# Patient Record
Sex: Female | Born: 2000 | Race: White | Hispanic: No | Marital: Single | Smoking: Never smoker
Health system: Southern US, Community
[De-identification: ages and names within clinical notes are randomized; demographics above are authoritative.]

## PROBLEM LIST (undated history)

## (undated) DIAGNOSIS — K219 Gastro-esophageal reflux disease without esophagitis: Secondary | ICD-10-CM

## (undated) DIAGNOSIS — T7840XA Allergy, unspecified, initial encounter: Secondary | ICD-10-CM

## (undated) DIAGNOSIS — N946 Dysmenorrhea, unspecified: Secondary | ICD-10-CM

## (undated) HISTORY — DX: Gastro-esophageal reflux disease without esophagitis: K21.9

## (undated) HISTORY — DX: Allergy, unspecified, initial encounter: T78.40XA

## (undated) HISTORY — DX: Dysmenorrhea, unspecified: N94.6

## (undated) HISTORY — PX: LEG SURGERY: SHX1003

## (undated) HISTORY — PX: ANKLE SURGERY: SHX546

## (undated) HISTORY — PX: NO PAST SURGERIES: SHX2092

## (undated) HISTORY — PX: OTHER SURGICAL HISTORY: SHX169

---
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
## 2020-01-05 ENCOUNTER — Other Ambulatory Visit: Payer: Self-pay | Admitting: Physician Assistant

## 2020-01-05 DIAGNOSIS — F902 Attention-deficit hyperactivity disorder, combined type: Secondary | ICD-10-CM

## 2020-01-05 MED ORDER — AMPHETAMINE-DEXTROAMPHET ER 30 MG PO CP24: 30.0000 mg | ORAL_CAPSULE | Freq: Every day | ORAL | 0 refills | Status: DC

## 2020-01-05 NOTE — Telephone Encounter (Signed)
Medication Refill - Medication:  amphetamine-dextroamphetamine (ADDERALL XR) 30 MG 24 hr capsule  Has the patient contacted their pharmacy? Yes pharmacy told to call office.  Preferred Pharmacy (with phone number or street name):  [id number]ell Halim [address] Phone:  [phone]  Fax:  [phone]     Agent: Please be advised that RX refills may take up to 3 business days. We ask that you follow-up with your pharmacy.

## 2020-01-05 NOTE — Telephone Encounter (Signed)
Requested medication (s) are due for refill today: yes  Requested medication (s) are on the active medication list: yes  Last refill:  12/05/19  Future visit scheduled: no  Notes to clinic:  not delegated    Requested Prescriptions  Pending Prescriptions Disp Refills   amphetamine-dextroamphetamine (ADDERALL XR) 30 MG 24 hr capsule 30 capsule 0    Sig: Take 1 capsule (30 mg total) by mouth daily.      Not Delegated - Psychiatry:  Stimulants/ADHD Failed - 01/05/2020 11:04 AM      Failed - This refill cannot be delegated      Failed - Urine Drug Screen completed in last 360 days.      Failed - Valid encounter within last 3 months    Recent Outpatient Visits           2 months ago Attention deficit hyperactivity disorder (ADHD), combined type   [facility] [place], Alessandra Bevel[place], [place]   5 months ago Attention deficit hyperactivity disorder (ADHD), combined type   [facility] [place], [place], [place]   6 months ago Sore throat   [facility] [place], Alessandra [place], [place]   3 years ago Annual physical exam   [facility] Joycelyn Man [place], [place]

## 2020-02-17 ENCOUNTER — Other Ambulatory Visit: Payer: Self-pay | Admitting: Physician Assistant

## 2020-02-17 DIAGNOSIS — F902 Attention-deficit hyperactivity disorder, combined type: Secondary | ICD-10-CM

## 2020-02-17 MED ORDER — AMPHETAMINE-DEXTROAMPHET ER 30 MG PO CP24: 30.0000 mg | ORAL_CAPSULE | Freq: Every day | ORAL | 0 refills | Status: DC

## 2020-02-17 NOTE — Telephone Encounter (Signed)
Medication Refill - Medication:  amphetamine-dextroamphetamine (ADDERALL XR) 30 MG 24 hr capsule [[id number]]    Preferred Pharmacy (with phone number or street name):  [id number]ell Halim [address]  [address] [place] [address]  Phone: [phone] Fax: [phone]     Agent: Please be advised that RX refills may take up to 3 business days. We ask that you follow-up with your pharmacy.

## 2020-03-30 ENCOUNTER — Ambulatory Visit: Payer: 59 | Admitting: Physician Assistant

## 2020-04-02 ENCOUNTER — Encounter: Payer: Self-pay | Admitting: Physician Assistant

## 2020-04-02 ENCOUNTER — Ambulatory Visit: Payer: 59 | Admitting: Physician Assistant

## 2020-04-02 ENCOUNTER — Other Ambulatory Visit: Payer: Self-pay

## 2020-04-02 VITALS — BP 117/75 | HR 108 | Temp 96.8°F | Wt 140.0 lb

## 2020-04-02 DIAGNOSIS — L03011 Cellulitis of right finger: Secondary | ICD-10-CM | POA: Diagnosis not present

## 2020-04-02 DIAGNOSIS — F902 Attention-deficit hyperactivity disorder, combined type: Secondary | ICD-10-CM

## 2020-04-02 MED ORDER — AMPHETAMINE-DEXTROAMPHET ER 30 MG PO CP24: 30.0000 mg | ORAL_CAPSULE | Freq: Every day | ORAL | 0 refills | Status: DC

## 2020-04-02 MED ORDER — SULFAMETHOXAZOLE-TRIMETHOPRIM 800-160 MG PO TABS: 1.0000 | ORAL_TABLET | Freq: Every day | ORAL | 0 refills | Status: DC

## 2020-04-02 NOTE — Progress Notes (Signed)
Established patient visit   Patient: Krista Holmes   DOB: 2001-01-09   18 y.o. Female  MRN: [id number] Visit Date: 04/02/2020  Today's healthcare provider: Margaretann Loveless, PA-C   Chief Complaint  Patient presents with  . Hand Pain    Right finger.    Subjective    HPI Krista Holmes is an 19 yr old female that presents today with intermittent swelling, redness and pain of the 2nd finger right hand around the nail. Reports when she was a child her finger was smashed in a car door. Since then the nail has never grown in correctly. She recently had acrylic nails put on and noticed her nail bed started becoming more painful. She had the nails removed and the nail bed has continued to have redness and swelling. Pain comes and goes. Today it is not painful. She has had no discharge from it.   Patient Active Problem List   Diagnosis Date Noted  . ADHD 08/05/2019  . Anxiety 08/05/2019   Social History   Tobacco Use  . Smoking status: Never Smoker  . Smokeless tobacco: Never Used  Substance Use Topics  . Alcohol use: No  . Drug use: No   Allergies  Allergen Reactions  . Other Hives    Red Hi-C  . Omnicef [Cefdinir] Rash  . Zithromax [Azithromycin] Rash     Medications: Outpatient Medications Prior to Visit  Medication Sig  . amphetamine-dextroamphetamine (ADDERALL XR) 30 MG 24 hr capsule Take 1 capsule (30 mg total) by mouth daily.   No facility-administered medications prior to visit.    Review of Systems  Constitutional: Negative.   Respiratory: Negative.   Cardiovascular: Negative.   Musculoskeletal: Positive for arthralgias. Negative for back pain, gait problem, joint swelling, myalgias, neck pain and neck stiffness.  Neurological: Negative.       Objective    BP 117/75 (BP Location: Left Arm, Patient Position: Sitting, Cuff Size: Normal)   Pulse (!) 108   Temp (!) 96.8 F (36 C) (Temporal)   Wt 140 lb (63.5 kg)    Physical Exam Vitals  reviewed.  Constitutional:      General: She is not in acute distress.    Appearance: Normal appearance. She is well-developed and normal weight. She is not ill-appearing.  HENT:     Head: Normocephalic and atraumatic.  Pulmonary:     Effort: Pulmonary effort is normal. No respiratory distress.  Musculoskeletal:     Cervical back: Normal range of motion and neck supple.  Skin:    Findings: Erythema present.       Neurological:     Mental Status: She is alert.  Psychiatric:        Mood and Affect: Mood normal.        Behavior: Behavior normal.        Thought Content: Thought content normal.        Judgment: Judgment normal.       No results found for any visits on 04/02/20.  Assessment & Plan     1. Paronychia of finger of right hand Will treat with Bactrim as below for infection. Advised to use Epsom salt soaks as well. Call if not improving.  - sulfamethoxazole-trimethoprim (BACTRIM DS) 800-160 MG tablet; Take 1 tablet by mouth daily.  Dispense: 5 tablet; Refill: 0  2. Attention deficit hyperactivity disorder (ADHD), combined type Stable. Diagnosis pulled for medication refill. Continue current medical treatment plan. - amphetamine-dextroamphetamine (ADDERALL XR) 30 MG 24  hr capsule; Take 1 capsule (30 mg total) by mouth daily.  Dispense: 30 capsule; Refill: 0   No follow-ups on file.      Reynolds Bowl, PA-C, have reviewed all documentation for this visit. The documentation on 04/02/20 for the exam, diagnosis, procedures, and orders are all accurate and complete.   Krista Holmes  [facility] [phone] (phone) [phone] (fax)  [place]

## 2020-04-02 NOTE — Patient Instructions (Signed)
Paronychia Paronychia is an infection of the skin that surrounds a nail. It usually affects the skin around a fingernail, but it may also occur near a toenail. It often causes pain and swelling around the nail. In some cases, a collection of pus (abscess) can form near or under the nail.  This condition may develop suddenly, or it may develop gradually over a longer period. In most cases, paronychia is not serious, and it will clear up with treatment. What are the causes? This condition may be caused by bacteria or a fungus. These germs can enter the body through an opening in the skin, such as a cut or a hangnail. What increases the risk? This condition is more likely to develop in people who:  Get their hands wet often, such as those who work as dishwashers, bartenders, or nurses.  Bite their fingernails or suck their thumbs.  Trim their nails very short.  Have hangnails or injured fingertips.  Get manicures.  Have diabetes. What are the signs or symptoms? Symptoms of this condition include:  Redness and swelling of the skin near the nail.  Tenderness around the nail when you touch the area.  Pus-filled bumps under the skin at the base and sides of the nail (cuticle).  Fluid or pus under the nail.  Throbbing pain in the area. How is this diagnosed? This condition is diagnosed with a physical exam. In some cases, a sample of pus may be tested to determine what type of bacteria or fungus is causing the condition. How is this treated? Treatment depends on the cause and severity of your condition. If your condition is mild, it may clear up on its own in a few days or after soaking in warm water. If needed, treatment may include:  Antibiotic medicine, if your infection is caused by bacteria.  Antifungal medicine, if your infection is caused by a fungus.  A procedure to drain pus from an abscess.  Anti-inflammatory medicine (corticosteroids). Follow these instructions at  home: Wound care  Keep the affected area clean.  Soak the affected area in warm water, if told to do so by your health care provider. You may be told to do this for 20 minutes, 2-3 times a day.  Keep the area dry when you are not soaking it.  Do not try to drain an abscess yourself.  Follow instructions from your health care provider about how to take care of the affected area. Make sure you: ? Wash your hands with soap and water before you change your bandage (dressing). If soap and water are not available, use hand sanitizer. ? Change your dressing as told by your health care provider.  If you had an abscess drained, check the area every day for signs of infection. Check for: ? Redness, swelling, or pain. ? Fluid or blood. ? Warmth. ? Pus or a bad smell. Medicines   Take over-the-counter and prescription medicines only as told by your health care provider.  If you were prescribed an antibiotic medicine, take it as told by your health care provider. Do not stop taking the antibiotic even if you start to feel better. General instructions  Avoid contact with harsh chemicals.  Do not pick at the affected area. Prevention  To prevent this condition from happening again: ? Wear rubber gloves when washing dishes or doing other tasks that require your hands to get wet. ? Wear gloves if your hands might come in contact with cleaners or other chemicals. ? Avoid   injuring your nails or fingertips. ? Do not bite your nails or tear hangnails. ? Do not cut your nails very short. ? Do not cut your cuticles. ? Use clean nail clippers or scissors when trimming nails. Contact a health care provider if:  Your symptoms get worse or do not improve with treatment.  You have continued or increased fluid, blood, or pus coming from the affected area.  Your finger or knuckle becomes swollen or difficult to move. Get help right away if you have:  A fever or chills.  Redness spreading away  from the affected area.  Joint or muscle pain. Summary  Paronychia is an infection of the skin that surrounds a nail. It often causes pain and swelling around the nail. In some cases, a collection of pus (abscess) can form near or under the nail.  This condition may be caused by bacteria or a fungus. These germs can enter the body through an opening in the skin, such as a cut or a hangnail.  If your condition is mild, it may clear up on its own in a few days. If needed, treatment may include medicine or a procedure to drain pus from an abscess.  To prevent this condition from happening again, wear gloves if doing tasks that require your hands to get wet or to come in contact with chemicals. Also avoid injuring your nails or fingertips. This information is not intended to replace advice given to you by your health care provider. Make sure you discuss any questions you have with your health care provider. Document Revised: 11/27/2017 Document Reviewed: 11/23/2017 Elsevier Patient Education  2020 Elsevier Inc.  

## 2020-04-03 ENCOUNTER — Other Ambulatory Visit: Payer: Self-pay

## 2020-04-03 DIAGNOSIS — F902 Attention-deficit hyperactivity disorder, combined type: Secondary | ICD-10-CM

## 2020-04-03 NOTE — Telephone Encounter (Signed)
Copied from CRM [id number]. Topic: General - Other >> Apr 03, 2020  4:50 PM Dalphine Handing A wrote: Patients mother stated that the pharmacy received a blank paper yesterday and needs her prescription for adderall sent back over today. [facility]ae advise [id number] [place], [place] - [address]  Phone:  [phone] Fax:  [phone]

## 2020-04-04 MED ORDER — AMPHETAMINE-DEXTROAMPHET ER 30 MG PO CP24: 30.0000 mg | ORAL_CAPSULE | Freq: Every day | ORAL | 0 refills | Status: DC

## 2020-04-05 MED ORDER — AMPHETAMINE-DEXTROAMPHET ER 30 MG PO CP24: 30.0000 mg | ORAL_CAPSULE | Freq: Every day | ORAL | 0 refills | Status: DC

## 2020-04-05 NOTE — Addendum Note (Signed)
Addended by: Kavin Leech E on: 04/05/2020 08:08 AM   Modules accepted: Orders

## 2020-04-05 NOTE — Telephone Encounter (Signed)
Pt mother is calling and the pharm has not received the rx

## 2020-04-19 ENCOUNTER — Encounter: Payer: Self-pay | Admitting: Family Medicine

## 2020-04-19 NOTE — Progress Notes (Signed)
Patient did not keep appointment today. She may call to reschedule.  

## 2020-04-26 ENCOUNTER — Telehealth: Payer: Self-pay | Admitting: Physician Assistant

## 2020-04-26 DIAGNOSIS — L03011 Cellulitis of right finger: Secondary | ICD-10-CM

## 2020-04-26 MED ORDER — SULFAMETHOXAZOLE-TRIMETHOPRIM 800-160 MG PO TABS: 1.0000 | ORAL_TABLET | Freq: Two times a day (BID) | ORAL | 0 refills | Status: DC

## 2020-04-26 NOTE — Telephone Encounter (Signed)
Krista Holmes, at first I told them they needed to be seen.  Then I saw that it was less than a month ago and knew we couldn't see her today so I asked [facility] to do a message.  I asked [facility] to let the grandmother know that we would get a message to you but they might need to come in for more antibiotics.

## 2020-04-26 NOTE — Telephone Encounter (Signed)
Sent in Bactrim. If not improving will need re-evaluation

## 2020-04-26 NOTE — Telephone Encounter (Signed)
Patient's grandmother, Claudell Kyle is calling to request a refill for [facility] for the patient's index finger. Patient was seen on 04/02/20. Patient is reporting drainage, yellowish in color with pain. Patient has been soaking it in episom salt.  Patient's grandmother feels that it is worse than it was on 04/02/20. [facility] the [employer]. Claudell Kyle Cb- [phone]

## 2020-04-26 NOTE — Telephone Encounter (Signed)
Patient advised as below.  

## 2020-05-16 ENCOUNTER — Other Ambulatory Visit: Payer: Self-pay | Admitting: Physician Assistant

## 2020-05-16 DIAGNOSIS — F902 Attention-deficit hyperactivity disorder, combined type: Secondary | ICD-10-CM

## 2020-05-16 MED ORDER — AMPHETAMINE-DEXTROAMPHET ER 30 MG PO CP24: 30.0000 mg | ORAL_CAPSULE | Freq: Every day | ORAL | 0 refills | Status: DC

## 2020-05-16 NOTE — Telephone Encounter (Signed)
PT need a refill  amphetamine-dextroamphetamine (ADDERALL XR) 30 MG 24 hr capsule [[id number]]  [id number] [address] [address]  [address] [place] [address]  Phone: [phone] Fax: [phone]

## 2020-05-16 NOTE — Telephone Encounter (Signed)
Requested medication (s) are due for refill today: Yes  Requested medication (s) are on the active medication list: Yes  Last refill:  04/05/20  Future visit scheduled: No  Notes to clinic:  See request.    Requested Prescriptions  Pending Prescriptions Disp Refills   amphetamine-dextroamphetamine (ADDERALL XR) 30 MG 24 hr capsule 30 capsule 0    Sig: Take 1 capsule (30 mg total) by mouth daily.      Not Delegated - Psychiatry:  Stimulants/ADHD Failed - 05/16/2020  8:41 AM      Failed - This refill cannot be delegated      Failed - Urine Drug Screen completed in last 360 days.      Passed - Valid encounter within last 3 months    Recent Outpatient Visits           1 month ago Paronychia of finger of right hand   [facility] Joy[place], [place]   7 months ago Attention deficit hyperactivity disorder (ADHD), combined type   [facility] Joycelyn Man [place], [place]   9 months ago Attention deficit hyperactivity disorder (ADHD), combined type   [facility] [place], [place], [place]   11 months ago Sore throat   [facility] [place], Alessandra [place], [place]   3 years ago Annual physical exam   [facility] Margaretann Loveless, [place]

## 2020-05-16 NOTE — Telephone Encounter (Signed)
Refilled x 1 month. Patient should schedule ADHD follow up with [facility].

## 2020-05-29 ENCOUNTER — Encounter: Payer: Self-pay | Admitting: Family Medicine

## 2020-05-29 NOTE — Progress Notes (Signed)
Patient did not keep appointment today. She may call to reschedule.  

## 2020-06-25 ENCOUNTER — Other Ambulatory Visit: Payer: Self-pay | Admitting: Physician Assistant

## 2020-06-25 DIAGNOSIS — F902 Attention-deficit hyperactivity disorder, combined type: Secondary | ICD-10-CM

## 2020-06-25 MED ORDER — AMPHETAMINE-DEXTROAMPHET ER 30 MG PO CP24: 30.0000 mg | ORAL_CAPSULE | Freq: Every day | ORAL | 0 refills | Status: DC

## 2020-06-25 NOTE — Telephone Encounter (Signed)
Requested medication (s) are due for refill today: yes  Requested medication (s) are on the active medication list: yes  Last refill:  05/16/2020  Future visit scheduled: no  Notes to clinic: this refill cannot be delegated    Requested Prescriptions  Pending Prescriptions Disp Refills   amphetamine-dextroamphetamine (ADDERALL XR) 30 MG 24 hr capsule 30 capsule 0    Sig: Take 1 capsule (30 mg total) by mouth daily.      Not Delegated - Psychiatry:  Stimulants/ADHD Failed - 06/25/2020  1:14 PM      Failed - This refill cannot be delegated      Failed - Urine Drug Screen completed in last 360 days.      Passed - Valid encounter within last 3 months    Recent Outpatient Visits           2 months ago Paronychia of finger of right hand   [facility] Joycelyn [place], [place]   8 months ago Attention deficit hyperactivity disorder (ADHD), combined type   [facility] Joycelyn Man [place], [place]   10 months ago Attention deficit hyperactivity disorder (ADHD), combined type   [facility], Alessandra [place], [place]   1 year ago Sore throat   [facility] Margaretann Loveless, [place]   4 years ago Annual physical exam   [facility] [place] Margaretann Lovel[place], [place]

## 2020-06-25 NOTE — Telephone Encounter (Signed)
Medication Refill - Medication: amphetamine-dextroamphetamine (ADDERALL XR) 30 MG 24 hr capsule    Has the patient contacted their pharmacy? yes (Agent: If no, request that the patient contact the pharmacy for the refill.) (Agent: If yes, when and what did the pharmacy advise?)Contact PCP  Preferred Pharmacy (with phone number or street name):  [id number] [address]im [address] Phone:  [phone]  Fax:  [phone]       Agent: Please be advised that RX refills may take up to 3 business days. We ask that you follow-up with your pharmacy.

## 2020-07-24 ENCOUNTER — Telehealth: Payer: Self-pay

## 2020-07-24 NOTE — Telephone Encounter (Signed)
Left detailed voice message on fathers cell as requested at [phone] and tried reaching family at home number with no response.  appt has been scheduled for 07/25/20 at 9:40AM on mychart. KW

## 2020-07-24 NOTE — Telephone Encounter (Signed)
You can use my 940 appt that is blocked tomorrow morning for virtual.  Can see if anyone could squeeze in appt this afternoon.  Since today is day 10, consider calling in referral to [facility] today to see if she could start today.  Too late tomorrow.

## 2020-07-24 NOTE — Telephone Encounter (Signed)
Copied from CRM [id number]. Topic: General - Other >> Jul 24, 2020  8:10 AM Jaquita Rector A wrote: Reason for CRM: Krista Holmes patients father called to inquire of her PCP if she may need an antibiotic. States that she have covid for over a week but when she take a deep breath in she goes into a coughing fit and can't seem to stop. He is asking for a call back to discuss what to do.please call Ph#  [phone]

## 2020-07-24 NOTE — Telephone Encounter (Signed)
Recommend virtual visit to assess patient.  Unlikely that an antibiotic would play a role as she has a viral illness (COVID19)

## 2020-07-24 NOTE — Telephone Encounter (Signed)
Spoke with patients father who understood, I let father know next availability for an appointment wouldn't be until tomorrow afternoon at 2:40PM. Patients father states that he did not want to wait that long for patient to be seen, he states that patient is on day 10 of Covid and cough and shortness of breath have not been improving, patients father states that o2 stats at home have been 73 and they have been giving patient otc Mucinex with no relief. Father reports that patient has been fatigued and every time she moves there is cough starting again. I advised parent that he can take patient to urgent care for assessment and treatment but he would like to know if providers cannot see patient this afternoon if he can be squeezed in tomorrow morning? KW

## 2020-07-25 ENCOUNTER — Telehealth (INDEPENDENT_AMBULATORY_CARE_PROVIDER_SITE_OTHER): Payer: 59 | Admitting: Family Medicine

## 2020-07-25 ENCOUNTER — Encounter: Payer: Self-pay | Admitting: Family Medicine

## 2020-07-25 ENCOUNTER — Other Ambulatory Visit: Payer: Self-pay

## 2020-07-25 ENCOUNTER — Ambulatory Visit
Admission: RE | Admit: 2020-07-25 | Discharge: 2020-07-25 | Disposition: A | Discharge status: Home / Self Care | Payer: 59 | Attending: Family Medicine | Admitting: Family Medicine

## 2020-07-25 ENCOUNTER — Ambulatory Visit
Admission: RE | Admit: 2020-07-25 | Discharge: 2020-07-25 | Disposition: A | Discharge status: Home / Self Care | Payer: 59 | Source: Ambulatory Visit | Attending: Family Medicine | Admitting: Family Medicine

## 2020-07-25 DIAGNOSIS — R0602 Shortness of breath: Secondary | ICD-10-CM

## 2020-07-25 DIAGNOSIS — U071 COVID-19: Secondary | ICD-10-CM | POA: Insufficient documentation

## 2020-07-25 MED ORDER — ALBUTEROL SULFATE HFA 108 (90 BASE) MCG/ACT IN AERS: 2.0000 | INHALATION_SPRAY | Freq: Four times a day (QID) | RESPIRATORY_TRACT | 0 refills | Status: DC | PRN

## 2020-07-25 NOTE — Patient Instructions (Signed)
COVID-19 COVID-19 is a respiratory infection that is caused by a virus called severe acute respiratory syndrome coronavirus 2 (SARS-CoV-2). The disease is also known as coronavirus disease or novel coronavirus. In some people, the virus may not cause any symptoms. In others, it may cause a serious infection. The infection can get worse quickly and can lead to complications, such as:  Pneumonia, or infection of the lungs.  Acute respiratory distress syndrome or ARDS. This is a condition in which fluid build-up in the lungs prevents the lungs from filling with air and passing oxygen into the blood.  Acute respiratory failure. This is a condition in which there is not enough oxygen passing from the lungs to the body or when carbon dioxide is not passing from the lungs out of the body.  Sepsis or septic shock. This is a serious bodily reaction to an infection.  Blood clotting problems.  Secondary infections due to bacteria or fungus.  Organ failure. This is when your body's organs stop working. The virus that causes COVID-19 is contagious. This means that it can spread from person to person through droplets from coughs and sneezes (respiratory secretions). What are the causes? This illness is caused by a virus. You may catch the virus by:  Breathing in droplets from an infected person. Droplets can be spread by a person breathing, speaking, singing, coughing, or sneezing.  Touching something, like a table or a doorknob, that was exposed to the virus (contaminated) and then touching your mouth, nose, or eyes. What increases the risk? Risk for infection You are more likely to be infected with this virus if you:  Are within 6 feet (2 meters) of a person with COVID-19.  Provide care for or live with a person who is infected with COVID-19.  Spend time in crowded indoor spaces or live in shared housing. Risk for serious illness You are more likely to become seriously ill from the virus if you:   Are 50 years of age or older. The higher your age, the more you are at risk for serious illness.  Live in a nursing home or long-term care facility.  Have cancer.  Have a long-term (chronic) disease such as: ? Chronic lung disease, including chronic obstructive pulmonary disease or asthma. ? A long-term disease that lowers your body's ability to fight infection (immunocompromised). ? Heart disease, including heart failure, a condition in which the arteries that lead to the heart become narrow or blocked (coronary artery disease), a disease which makes the heart muscle thick, weak, or stiff (cardiomyopathy). ? Diabetes. ? Chronic kidney disease. ? Sickle cell disease, a condition in which red blood cells have an abnormal "sickle" shape. ? Liver disease.  Are obese. What are the signs or symptoms? Symptoms of this condition can range from mild to severe. Symptoms may appear any time from 2 to 14 days after being exposed to the virus. They include:  A fever or chills.  A cough.  Difficulty breathing.  Headaches, body aches, or muscle aches.  Runny or stuffy (congested) nose.  A sore throat.  New loss of taste or smell. Some people may also have stomach problems, such as nausea, vomiting, or diarrhea. Other people may not have any symptoms of COVID-19. How is this diagnosed? This condition may be diagnosed based on:  Your signs and symptoms, especially if: ? You live in an area with a COVID-19 outbreak. ? You recently traveled to or from an area where the virus is common. ? You   provide care for or live with a person who was diagnosed with COVID-19. ? You were exposed to a person who was diagnosed with COVID-19.  A physical exam.  Lab tests, which may include: ? Taking a sample of fluid from the back of your nose and throat (nasopharyngeal fluid), your nose, or your throat using a swab. ? A sample of mucus from your lungs (sputum). ? Blood tests.  Imaging tests, which  may include, X-rays, CT scan, or ultrasound. How is this treated? At present, there is no medicine to treat COVID-19. Medicines that treat other diseases are being used on a trial basis to see if they are effective against COVID-19. Your health care provider will talk with you about ways to treat your symptoms. For most people, the infection is mild and can be managed at home with rest, fluids, and over-the-counter medicines. Treatment for a serious infection usually takes places in a hospital intensive care unit (ICU). It may include one or more of the following treatments. These treatments are given until your symptoms improve.  Receiving fluids and medicines through an IV.  Supplemental oxygen. Extra oxygen is given through a tube in the nose, a face mask, or a hood.  Positioning you to lie on your stomach (prone position). This makes it easier for oxygen to get into the lungs.  Continuous positive airway pressure (CPAP) or bi-level positive airway pressure (BPAP) machine. This treatment uses mild air pressure to keep the airways open. A tube that is connected to a motor delivers oxygen to the body.  Ventilator. This treatment moves air into and out of the lungs by using a tube that is placed in your windpipe.  Tracheostomy. This is a procedure to create a hole in the neck so that a breathing tube can be inserted.  Extracorporeal membrane oxygenation (ECMO). This procedure gives the lungs a chance to recover by taking over the functions of the heart and lungs. It supplies oxygen to the body and removes carbon dioxide. Follow these instructions at home: Lifestyle  If you are sick, stay home except to get medical care. Your health care provider will tell you how long to stay home. Call your health care provider before you go for medical care.  Rest at home as told by your health care provider.  Do not use any products that contain nicotine or tobacco, such as cigarettes, e-cigarettes, and  chewing tobacco. If you need help quitting, ask your health care provider.  Return to your normal activities as told by your health care provider. Ask your health care provider what activities are safe for you. General instructions  Take over-the-counter and prescription medicines only as told by your health care provider.  Drink enough fluid to keep your urine pale yellow.  Keep all follow-up visits as told by your health care provider. This is important. How is this prevented?  There is no vaccine to help prevent COVID-19 infection. However, there are steps you can take to protect yourself and others from this virus. To protect yourself:   Do not travel to areas where COVID-19 is a risk. The areas where COVID-19 is reported change often. To identify high-risk areas and travel restrictions, check the CDC travel website: wwwnc.cdc.gov/travel/notices  If you live in, or must travel to, an area where COVID-19 is a risk, take precautions to avoid infection. ? Stay away from people who are sick. ? Wash your hands often with soap and water for 20 seconds. If soap and water   are not available, use an alcohol-based hand sanitizer. ? Avoid touching your mouth, face, eyes, or nose. ? Avoid going out in public, follow guidance from your state and local health authorities. ? If you must go out in public, wear a cloth face covering or face mask. Make sure your mask covers your nose and mouth. ? Avoid crowded indoor spaces. Stay at least 6 feet (2 meters) away from others. ? Disinfect objects and surfaces that are frequently touched every day. This may include:  Counters and tables.  Doorknobs and light switches.  Sinks and faucets.  Electronics, such as phones, remote controls, keyboards, computers, and tablets. To protect others: If you have symptoms of COVID-19, take steps to prevent the virus from spreading to others.  If you think you have a COVID-19 infection, contact your health care  provider right away. Tell your health care team that you think you may have a COVID-19 infection.  Stay home. Leave your house only to seek medical care. Do not use public transport.  Do not travel while you are sick.  Wash your hands often with soap and water for 20 seconds. If soap and water are not available, use alcohol-based hand sanitizer.  Stay away from other members of your household. Let healthy household members care for children and pets, if possible. If you have to care for children or pets, wash your hands often and wear a mask. If possible, stay in your own room, separate from others. Use a different bathroom.  Make sure that all people in your household wash their hands well and often.  Cough or sneeze into a tissue or your sleeve or elbow. Do not cough or sneeze into your hand or into the air.  Wear a cloth face covering or face mask. Make sure your mask covers your nose and mouth. Where to find more information  Centers for Disease Control and Prevention: www.cdc.gov/coronavirus/2019-ncov/index.html  World Health Organization: www.who.int/health-topics/coronavirus Contact a health care provider if:  You live in or have traveled to an area where COVID-19 is a risk and you have symptoms of the infection.  You have had contact with someone who has COVID-19 and you have symptoms of the infection. Get help right away if:  You have trouble breathing.  You have pain or pressure in your chest.  You have confusion.  You have bluish lips and fingernails.  You have difficulty waking from sleep.  You have symptoms that get worse. These symptoms may represent a serious problem that is an emergency. Do not wait to see if the symptoms will go away. Get medical help right away. Call your local emergency services (911 in the [place] Do not drive yourself to the hospital. Let the emergency medical personnel know if you think you have COVID-19. Summary  COVID-19 is a  respiratory infection that is caused by a virus. It is also known as coronavirus disease or novel coronavirus. It can cause serious infections, such as pneumonia, acute respiratory distress syndrome, acute respiratory failure, or sepsis.  The virus that causes COVID-19 is contagious. This means that it can spread from person to person through droplets from breathing, speaking, singing, coughing, or sneezing.  You are more likely to develop a serious illness if you are 50 years of age or older, have a weak immune system, live in a nursing home, or have chronic disease.  There is no medicine to treat COVID-19. Your health care provider will talk with you about ways to treat your symptoms.    Take steps to protect yourself and others from infection. Wash your hands often and disinfect objects and surfaces that are frequently touched every day. Stay away from people who are sick and wear a mask if you are sick. This information is not intended to replace advice given to you by your health care provider. Make sure you discuss any questions you have with your health care provider. Document Revised: 09/09/2019 Document Reviewed: 12/16/2018 Elsevier Patient Education  2020 Elsevier Inc.  

## 2020-07-25 NOTE — Progress Notes (Signed)
MyChart Video Visit    Virtual Visit via Video Note   This visit type was conducted due to national recommendations for restrictions regarding the COVID-19 Pandemic (e.g. social distancing) in an effort to limit this patient's exposure and mitigate transmission in our community. This patient is at least at moderate risk for complications without adequate follow up. This format is felt to be most appropriate for this patient at this time. Physical exam was limited by quality of the video and audio technology used for the visit.    Patient location: home Provider location: [facility] Persons involved in the visit: patient, provider   I discussed the limitations of evaluation and management by telemedicine and the availability of in person appointments. The patient expressed understanding and agreed to proceed.    Patient: Krista Holmes   DOB: 05-Mar-2001   19 y.o. Female  MRN: [id number] Visit Date: 07/25/2020  Today's healthcare provider: Shirlee Latch, MD   Chief Complaint  Patient presents with  . Cough   Subjective    HPI  Patient C/O cough x 12 days. Patient was seen at [facility] ER and tested positive for COVID on 07/20/2020. Patient is taking Guaifenesin prescribed by ER provider. Patient reports cough is unchanged. Reports shortness of breath with cough and activity. Patient reports cough is worse with activity.   Reviewed her records from [facility] health emergency department on 07/20/2020.  Also reviewed her imaging from that time.  2 view chest x-ray shows interstitial and hazy opacities in bilateral lung bases commonly seen with atypical/viral pneumonia.  Patient Active Problem List   Diagnosis Date Noted  . ADHD 08/05/2019  . Anxiety 08/05/2019   Social History   Tobacco Use  . Smoking status: Never Smoker  . Smokeless tobacco: Never Used  Substance Use Topics  . Alcohol use: No  . Drug use: No   Allergies  Allergen Reactions  . Other Hives     Red Hi-C  . Omnicef [Cefdinir] Rash  . Zithromax [Azithromycin] Rash      Medications: Outpatient Medications Prior to Visit  Medication Sig  . amphetamine-dextroamphetamine (ADDERALL XR) 30 MG 24 hr capsule Take 1 capsule (30 mg total) by mouth daily.  . Guaifenesin 200 MG/5ML LIQD Take by mouth.  . [DISCONTINUED] sulfamethoxazole-trimethoprim (BACTRIM DS) 800-160 MG tablet Take 1 tablet by mouth 2 (two) times daily.   No facility-administered medications prior to visit.    Review of Systems  Constitutional: Negative for chills and fever.  HENT: Positive for congestion and ear pain. Negative for rhinorrhea and sore throat.   Respiratory: Positive for cough, chest tightness and shortness of breath.       Objective    There were no vitals taken for this visit.   Physical Exam Constitutional:      General: She is not in acute distress.    Appearance: Normal appearance. She is not diaphoretic.  HENT:     Head: Normocephalic and atraumatic.  Pulmonary:     Effort: No respiratory distress.     Comments: Some pauses when talking a lot, but able to speak in full sentences Neurological:     Mental Status: She is alert and oriented to person, place, and time. Mental status is at baseline.  Psychiatric:        Mood and Affect: Mood normal.        Behavior: Behavior normal.        Assessment & Plan     1. COVID-19 2.  Shortness of breath -Tested positive for COVID-19 on 8/27, but she is on day 12 symptoms -She has been fever free for more than 48 hours and symptoms are improving, so she is able to come out of self-isolation -Given ongoing shortness of breath and previous chest x-ray results showing possible atypical pneumonia, will repeat chest x-ray to ensure no ongoing pneumonia -If chest x-ray shows ongoing opacities, consider possible superimposed bacterial pneumonia in the setting of COVID-19 and consider azithromycin -Discussed importance of seeking emergent  assistance if she becomes short of breath at rest and is unable to speak in full sentences -Albuterol to use as needed for symptom management -Discussed return precautions - DG Chest 2 View; Future   Return if symptoms worsen or fail to improve.     I discussed the assessment and treatment plan with the patient. The patient was provided an opportunity to ask questions and all were answered. The patient agreed with the plan and demonstrated an understanding of the instructions.   The patient was advised to call back or seek an in-person evaluation if the symptoms worsen or if the condition fails to improve as anticipated.  I, Shirlee Latch, MD, have reviewed all documentation for this visit. The documentation on 07/25/20 for the exam, diagnosis, procedures, and orders are all accurate and complete.   Katiejo Gilroy, Marzella Schlein, MD, MPH [facility]

## 2020-07-26 ENCOUNTER — Telehealth: Payer: Self-pay | Admitting: Physician Assistant

## 2020-07-26 ENCOUNTER — Telehealth: Payer: Self-pay

## 2020-07-26 DIAGNOSIS — U071 COVID-19: Secondary | ICD-10-CM

## 2020-07-26 MED ORDER — AMOXICILLIN-POT CLAVULANATE 875-125 MG PO TABS: 1.0000 | ORAL_TABLET | Freq: Two times a day (BID) | ORAL | 0 refills | Status: DC

## 2020-07-26 MED ORDER — DOXYCYCLINE HYCLATE 100 MG PO TABS: 100.0000 mg | ORAL_TABLET | Freq: Two times a day (BID) | ORAL | 0 refills | Status: DC

## 2020-07-26 NOTE — Telephone Encounter (Signed)
Unfortunately, the other option is azithromycin, which is on her allergy list.  Could use Levaquin, but this is more likely to cause GI issues than doxycycline even. Would take with a probiotic and see if she can tolerate.

## 2020-07-26 NOTE — Telephone Encounter (Signed)
Augmentin sent in to [facility]  Can stop Doxycycline

## 2020-07-26 NOTE — Telephone Encounter (Signed)
Patient's mother called to inform the doctor that the antibiotic prescribed is making the patient feel sick on the stomach.  Would like to know if there is another antibiotic that is just as effective.  CB# [phone] Alcario Drought, mother)

## 2020-07-26 NOTE — Telephone Encounter (Signed)
Pt advised.  She wanted to know if it is possible to try a different antibiotic.  She states Doxycycline cause a lot of stomach issues the last time she took it.  Please advise.   Pharmacy [facility] [place].    Thanks,   -Vernona Rieger

## 2020-07-26 NOTE — Telephone Encounter (Signed)
Left detailed message advising pt below. Doxy was sent to [facility] in [place],   -Vernona Rieger

## 2020-07-26 NOTE — Telephone Encounter (Signed)
Patients mother has been advised. KW 

## 2020-07-26 NOTE — Telephone Encounter (Signed)
-----   Message from Erasmo Downer, MD sent at 07/26/2020  8:39 AM EDT ----- Chest Xray shows bilateral pneumonia.  Start doxycycline 100mg  twice daily for 7 days - please send eRx.

## 2020-08-03 ENCOUNTER — Telehealth: Payer: Self-pay

## 2020-08-03 DIAGNOSIS — U071 COVID-19: Secondary | ICD-10-CM

## 2020-08-03 DIAGNOSIS — J1282 Pneumonia due to coronavirus disease 2019: Secondary | ICD-10-CM

## 2020-08-03 MED ORDER — PREDNISONE 10 MG (21) PO TBPK: ORAL_TABLET | ORAL | 0 refills | Status: DC

## 2020-08-03 MED ORDER — AMOXICILLIN-POT CLAVULANATE 875-125 MG PO TABS: 1.0000 | ORAL_TABLET | Freq: Two times a day (BID) | ORAL | 0 refills | Status: DC

## 2020-08-03 NOTE — Telephone Encounter (Signed)
Velna Hatchet grandmother advised as directed below.She is not sure which pharmacy is close by [place] but she is going to call back to give information. Ok for [facility] nurse to take information.

## 2020-08-03 NOTE — Telephone Encounter (Signed)
Sent in medications to [facility]

## 2020-08-03 NOTE — Telephone Encounter (Signed)
Copied from CRM [id number]. Topic: General - Inquiry >> Aug 02, 2020  2:12 PM Krista Holmes wrote: Reason for CRM: Patient has started experiencing sinus pressure and still has a cough that requires the use of an inhaler. The mother Krista Holmes) would like to know if she needs another round of the antibiotic. Some shortness of breath when coughing. She does look and seem better but concerned about the sinus pressure that has come now. Please call the mother back at [phone]. Please advise

## 2020-08-03 NOTE — Telephone Encounter (Signed)
Please review. Thanks!  

## 2020-08-03 NOTE — Telephone Encounter (Signed)
Spoke with patients grandmother Velna Hatchet who states that patient had virtual appt with Dr. Leonard Schwartz on 07/25/20 and started on Doxycyline. On 07/26/20 patients mother called office back stating that medication was not sitting well with patient and we switched patient to Augmentin. Patients grandmother states that patient never picked up Augmentin and continued course of Doxy along with otc Pepcid. Patients grandmother reports that patient seemed to be doing well Sunday but cough is still present and shortness of breath.Patients grandmother states that patient is using Ventolin inhaler to control her cough every 6hrs and when she is not using inhaler she has a dry like cough. As of the other day patient started to complain of sinus pressure, she has returned back to school and grandmother would like to know if patient needs to be on another round of antibiotics or if there is anything she can take otc. There are no open appointments available in office today with any provider. Please advise. KW

## 2020-08-03 NOTE — Addendum Note (Signed)
Addended by: Margaretann Loveless on: 08/03/2020 05:33 PM   Modules accepted: Orders

## 2020-08-03 NOTE — Telephone Encounter (Signed)
She could pick up the augmentin or I could send to [facility] if I needed. Also could add prednisone to the ventolin

## 2020-08-03 NOTE — Telephone Encounter (Signed)
Pt grandmother called back to request that the Rx [facility] [address]

## 2020-08-16 ENCOUNTER — Other Ambulatory Visit: Payer: Self-pay | Admitting: Physician Assistant

## 2020-08-16 DIAGNOSIS — F902 Attention-deficit hyperactivity disorder, combined type: Secondary | ICD-10-CM

## 2020-08-16 NOTE — Telephone Encounter (Signed)
Medication Refill - Medication: Adderall 30 mg XR  Has the patient contacted their pharmacy? No. (Agent: If no, request that the patient contact the pharmacy for the refill.) (Agent: If yes, when and what did the pharmacy advise?)  Preferred Pharmacy (with phone number or street name): [facility] [place]  [address]  Agent: Please be advised that RX refills may take up to 3 business days. We ask that you follow-up with your pharmacy.

## 2020-08-16 NOTE — Telephone Encounter (Signed)
Requested medication (s) are due for refill today: yes  Requested medication (s) are on the active medication list: yes  Last refill:  06/25/20  Future visit scheduled: no  Notes to clinic: not delegated    Requested Prescriptions  Pending Prescriptions Disp Refills   amphetamine-dextroamphetamine (ADDERALL XR) 30 MG 24 hr capsule 30 capsule 0    Sig: Take 1 capsule (30 mg total) by mouth daily.      Not Delegated - Psychiatry:  Stimulants/ADHD Failed - 08/16/2020  2:57 PM      Failed - This refill cannot be delegated      Failed - Urine Drug Screen completed in last 360 days.      Passed - Valid encounter within last 3 months    Recent Outpatient Visits           3 weeks ago COVID-19   [facility], Marzella Schlein, MD   4 months ago Paronychia of finger of right hand   [facility] Joycelyn Man [place], [place]   10 months ago Attention deficit hyperactivity disorder (ADHD), combined type   [facility], Alessandra Bevels, PA-C   1 year ago Attention deficit hyperactivity disorder (ADHD), combined type   [facility], Alessandra Bevels, [place]   1 year ago Sore throat   [facility] Joycelyn Man [place], [place]

## 2020-08-17 MED ORDER — AMPHETAMINE-DEXTROAMPHET ER 30 MG PO CP24: 30.0000 mg | ORAL_CAPSULE | Freq: Every day | ORAL | 0 refills | Status: DC

## 2020-09-03 ENCOUNTER — Encounter: Payer: Self-pay | Admitting: Physician Assistant

## 2020-09-03 ENCOUNTER — Telehealth (INDEPENDENT_AMBULATORY_CARE_PROVIDER_SITE_OTHER): Payer: 59 | Admitting: Physician Assistant

## 2020-09-03 DIAGNOSIS — Z30011 Encounter for initial prescription of contraceptive pills: Secondary | ICD-10-CM | POA: Diagnosis not present

## 2020-09-03 MED ORDER — NORETHIN ACE-ETH ESTRAD-FE 1-20 MG-MCG PO TABS: 1.0000 | ORAL_TABLET | Freq: Every day | ORAL | 4 refills | Status: DC

## 2020-09-03 NOTE — Progress Notes (Signed)
MyChart Video Visit    Virtual Visit via Video Note   This visit type was conducted due to national recommendations for restrictions regarding the COVID-19 Pandemic (e.g. social distancing) in an effort to limit this patient's exposure and mitigate transmission in our community. This patient is at least at moderate risk for complications without adequate follow up. This format is felt to be most appropriate for this patient at this time. Physical exam was limited by quality of the video and audio technology used for the visit.   Patient location: Home Provider location: [facility]  I discussed the limitations of evaluation and management by telemedicine and the availability of in person appointments. The patient expressed understanding and agreed to proceed.  Patient: Krista Holmes   DOB: 12/22/2000   19 y.o. Female  MRN: [id number] Visit Date: 09/03/2020  Today's healthcare provider: Margaretann Loveless, PA-C   No chief complaint on file.  Subjective    HPI   Krista Holmes presents today via mychart video visit for OCP. She reports she would like to start for acne control and for period control. She reports she does not have terrible PMS symptoms, but she does have irregular menstrual cycles. She has not tried anything in the past.   Patient Active Problem List   Diagnosis Date Noted  . ADHD 08/05/2019  . Anxiety 08/05/2019   Past Medical History:  Diagnosis Date  . Allergy    Seasonal allergies/ sneezing, coughing etc  . GERD (gastroesophageal reflux disease)       Medications: Outpatient Medications Prior to Visit  Medication Sig  . albuterol (VENTOLIN HFA) 108 (90 Base) MCG/ACT inhaler Inhale 2 puffs into the lungs every 6 (six) hours as needed for wheezing or shortness of breath.  Marland Kitchen amoxicillin-clavulanate (AUGMENTIN) 875-125 MG tablet Take 1 tablet by mouth 2 (two) times daily.  Marland Kitchen amphetamine-dextroamphetamine (ADDERALL XR) 30 MG 24 hr capsule Take 1 capsule (30  mg total) by mouth daily.  . Guaifenesin 200 MG/5ML LIQD Take by mouth.  . predniSONE (STERAPRED UNI-PAK 21 TAB) 10 MG (21) TBPK tablet 6 day taper; take as directed on package instructions   No facility-administered medications prior to visit.    Review of Systems  Last CBC No results found for: WBC, HGB, HCT, MCV, MCH, RDW, PLT Last metabolic panel No results found for: GLUCOSE, NA, K, CL, CO2, BUN, CREATININE, GFRNONAA, GFRAA, CALCIUM, PHOS, PROT, ALBUMIN, LABGLOB, AGRATIO, BILITOT, ALKPHOS, AST, ALT, ANIONGAP    Objective    There were no vitals taken for this visit. BP Readings from Last 3 Encounters:  04/02/20 117/75  05/22/16 110/70 (54 %, Z = 0.10 /  66 %, Z = 0.42)*   *BP percentiles are based on the 2017 AAP Clinical Practice Guideline for girls   Wt Readings from Last 3 Encounters:  04/02/20 140 lb (63.5 kg) (73 %, Z= 0.60)*  06/21/19 140 lb (63.5 kg) (75 %, Z= 0.68)*  05/22/16 157 lb 12.8 oz (71.6 kg) (93 %, Z= 1.46)*   * Growth percentiles are based on CDC (Girls, 2-20 Years) data.      Physical Exam     Assessment & Plan     1. Encounter for oral contraception initial prescription Will start Junel as below. Advised to try for 3 months and call if having any breakthrough symptoms or not great control of the acne we can increase dose. She agrees.  - norethindrone-ethinyl estradiol (JUNEL FE 1/20) 1-20 MG-MCG tablet; Take 1 tablet by  mouth daily.  Dispense: 84 tablet; Refill: 4   No follow-ups on file.     I discussed the assessment and treatment plan with the patient. The patient was provided an opportunity to ask questions and all were answered. The patient agreed with the plan and demonstrated an understanding of the instructions.   The patient was advised to call back or seek an in-person evaluation if the symptoms worsen or if the condition fails to improve as anticipated.  I provided 13 minutes of face-to-face time during this encounter via MyChart  Video enabled encounter.  Delmer Islam, PA-C, have reviewed all documentation for this visit. The documentation on 09/03/20 for the exam, diagnosis, procedures, and orders are all accurate and complete.  Reine Just [facility] [phone] (phone) [phone] (fax)  [facility]

## 2020-09-18 ENCOUNTER — Other Ambulatory Visit: Payer: Self-pay | Admitting: Physician Assistant

## 2020-09-18 DIAGNOSIS — F902 Attention-deficit hyperactivity disorder, combined type: Secondary | ICD-10-CM

## 2020-09-18 NOTE — Telephone Encounter (Signed)
Requested medication (s) are due for refill today: yes  Requested medication (s) are on the active medication list: yes  Last refill:  08/17/20  Future visit scheduled: no  Notes to clinic:  not delegated    Requested Prescriptions  Pending Prescriptions Disp Refills   amphetamine-dextroamphetamine (ADDERALL XR) 30 MG 24 hr capsule 30 capsule 0    Sig: Take 1 capsule (30 mg total) by mouth daily.      Not Delegated - Psychiatry:  Stimulants/ADHD Failed - 09/18/2020 11:08 AM      Failed - This refill cannot be delegated      Failed - Urine Drug Screen completed in last 360 days.      Passed - Valid encounter within last 3 months    Recent Outpatient Visits           2 weeks ago Encounter for oral contraception initial prescription   [facility] [place], Alessandra Bevels, [place]   1 month ago COVID-19   [facility], Marzella Schlein, [place]   5 months ago Paronychia of finger of right hand   [facility] Joycelyn Man M, [place]   11 months ago Attention deficit hyperactivity disorder (ADHD), combined type   [facility], Alessandra Bevels, PA-C   1 year ago Attention deficit hyperactivity disorder (ADHD), combined type   [facility], [place], [place]

## 2020-09-18 NOTE — Telephone Encounter (Signed)
Pt request refill   amphetamine-dextroamphetamine (ADDERALL XR) 30 MG 24 hr capsule  [id number] [place], [place] - [address] AT [facility] [address] Phone:  [phone]  Fax:  [phone]

## 2020-09-19 MED ORDER — AMPHETAMINE-DEXTROAMPHET ER 30 MG PO CP24: 30.0000 mg | ORAL_CAPSULE | Freq: Every day | ORAL | 0 refills | Status: DC

## 2020-10-02 ENCOUNTER — Telehealth (INDEPENDENT_AMBULATORY_CARE_PROVIDER_SITE_OTHER): Payer: 59 | Admitting: Physician Assistant

## 2020-10-02 DIAGNOSIS — S6991XA Unspecified injury of right wrist, hand and finger(s), initial encounter: Secondary | ICD-10-CM

## 2020-10-02 DIAGNOSIS — M7989 Other specified soft tissue disorders: Secondary | ICD-10-CM

## 2020-10-02 NOTE — Progress Notes (Signed)
MyChart Video Visit    Virtual Visit via Video Note   This visit type was conducted due to national recommendations for restrictions regarding the COVID-19 Pandemic (e.g. social distancing) in an effort to limit this patient's exposure and mitigate transmission in our community. This patient is at least at moderate risk for complications without adequate follow up. This format is felt to be most appropriate for this patient at this time. Physical exam was limited by quality of the video and audio technology used for the visit.   Patient location: Home Provider location: Office   I discussed the limitations of evaluation and management by telemedicine and the availability of in person appointments. The patient expressed understanding and agreed to proceed.  Patient: Krista Holmes   DOB: [birth year]   19 y.o. Female  MRN: [id number] Visit Date: 10/02/2020  Today's healthcare provider: Trey Sailors, PA-C   Chief Complaint  Patient presents with  . finger swelling   Subjective     She reports she had an injury as a young child where she damaged her right index finger and it required a procedure to repair it. She reports her fingernail has grown back abnormally since then. Since may of this year she has been having issues with finger swelling of her right index finger. She was treated for paronychia in May 2021 with Bactrim. She reports this antibiotic as well as several other did not help. She denies pain. She denies drainage from the area.      Medications: Outpatient Medications Prior to Visit  Medication Sig  . amphetamine-dextroamphetamine (ADDERALL XR) 30 MG 24 hr capsule Take 1 capsule (30 mg total) by mouth daily.  . norethindrone-ethinyl estradiol (JUNEL FE 1/20) 1-20 MG-MCG tablet Take 1 tablet by mouth daily.   No facility-administered medications prior to visit.    Review of Systems  HENT: Positive for congestion.   Respiratory: Positive for cough.        Objective    There were no vitals taken for this visit.   Physical Exam Constitutional:      Appearance: Normal appearance.  Musculoskeletal:     Comments: Some mild swelling of distal end of right finger tip. The nailbed does appear slightly irregular at the proximal end.   Neurological:     Mental Status: She is alert.  Psychiatric:        Mood and Affect: Mood normal.        Behavior: Behavior normal.        Assessment & Plan    1. Finger swelling  Recurrent issue that does not seem to respond to antibiotics. May be another dermatologic condition, will have her see derm. Offered xray, she defers right now but we can order if she changes her mind. - Ambulatory referral to Dermatology  2. Injury of finger of right hand, initial encounter    No follow-ups on file.     I discussed the assessment and treatment plan with the patient. The patient was provided an opportunity to ask questions and all were answered. The patient agreed with the plan and demonstrated an understanding of the instructions.   The patient was advised to call back or seek an in-person evaluation if the symptoms worsen or if the condition fails to improve as anticipated.  I spent 20 minutes dedicated to the care of this patient on the date of this encounter to include pre-visit review of records, face-to-face time with the patient discussing skin infections, and post  visit ordering of testing.  ITrey Sailors, PA-C, have reviewed all documentation for this visit. The documentation on 10/02/20 for the exam, diagnosis, procedures, and orders are all accurate and complete.  The entirety of the information documented in the History of Present Illness, Review of Systems and Physical Exam were personally obtained by me. Portions of this information were initially documented by [facility] and reviewed by me for thoroughness and accuracy.    Maryella Shivers [facility] [phone] (phone) [phone] (fax)  [facility]

## 2020-10-23 ENCOUNTER — Other Ambulatory Visit: Payer: Self-pay | Admitting: Physician Assistant

## 2020-10-23 DIAGNOSIS — F902 Attention-deficit hyperactivity disorder, combined type: Secondary | ICD-10-CM

## 2020-10-23 MED ORDER — AMPHETAMINE-DEXTROAMPHET ER 30 MG PO CP24: 30.0000 mg | ORAL_CAPSULE | Freq: Every day | ORAL | 0 refills | Status: DC

## 2020-10-23 NOTE — Telephone Encounter (Signed)
Medication Refill - Medication: amphetamine-dextroamphetamine (ADDERALL XR) 30 MG 24 hr capsule norethindrone-ethinyl estradiol (JUNEL FE 1/20) 1-20 MG-MCG tablet      Preferred Pharmacy (with phone number or street name):  [id number] [place], [place] - [address] AT [facility] [address] Phone:  [phone]  Fax:  [phone]       Agent: Please be advised that RX refills may take up to 3 business days. We ask that you follow-up with your pharmacy.

## 2020-10-23 NOTE — Telephone Encounter (Signed)
Requested medication (s) are due for refill today:   Provider to determine  Requested medication (s) are on the active medication list:   Yes  Future visit scheduled:   No   Last ordered: 09/19/2020 #30, 0 refills  Non delegated refill   Requested Prescriptions  Pending Prescriptions Disp Refills   amphetamine-dextroamphetamine (ADDERALL XR) 30 MG 24 hr capsule 30 capsule 0    Sig: Take 1 capsule (30 mg total) by mouth daily.      Not Delegated - Psychiatry:  Stimulants/ADHD Failed - 10/23/2020  3:15 PM      Failed - This refill cannot be delegated      Failed - Urine Drug Screen completed in last 360 days      Passed - Valid encounter within last 3 months    Recent Outpatient Visits           3 weeks ago Finger swelling   [facility], Lavella Hammock, PA-C   1 month ago Encounter for oral contraception initial prescription   [facility] Margaretann Loveless, [place]   3 months ago COVID-19   [facility], Marzella Schlein, [place]   6 months ago Paronychia of finger of right hand   [facility] [place], Alessandra Bevels, [place]   1 year ago Attention deficit hyperactivity disorder (ADHD), combined type   [facility], Alessandra Bevels, [place]

## 2020-11-28 ENCOUNTER — Ambulatory Visit: Payer: 59 | Admitting: Physician Assistant

## 2020-11-30 ENCOUNTER — Ambulatory Visit
Admission: RE | Admit: 2020-11-30 | Discharge: 2020-11-30 | Disposition: A | Discharge status: Home / Self Care | Payer: 59 | Attending: Physician Assistant | Admitting: Physician Assistant

## 2020-11-30 ENCOUNTER — Other Ambulatory Visit: Payer: Self-pay

## 2020-11-30 ENCOUNTER — Ambulatory Visit
Admission: RE | Admit: 2020-11-30 | Discharge: 2020-11-30 | Disposition: A | Discharge status: Home / Self Care | Payer: 59 | Source: Ambulatory Visit | Attending: Physician Assistant | Admitting: Physician Assistant

## 2020-11-30 ENCOUNTER — Telehealth (INDEPENDENT_AMBULATORY_CARE_PROVIDER_SITE_OTHER): Payer: 59 | Admitting: Physician Assistant

## 2020-11-30 DIAGNOSIS — R059 Cough, unspecified: Secondary | ICD-10-CM | POA: Insufficient documentation

## 2020-11-30 DIAGNOSIS — Z8616 Personal history of COVID-19: Secondary | ICD-10-CM

## 2020-11-30 DIAGNOSIS — F902 Attention-deficit hyperactivity disorder, combined type: Secondary | ICD-10-CM | POA: Diagnosis not present

## 2020-11-30 MED ORDER — AMPHETAMINE-DEXTROAMPHET ER 30 MG PO CP24: 30.0000 mg | ORAL_CAPSULE | Freq: Every day | ORAL | 0 refills | Status: DC

## 2020-11-30 MED ORDER — PREDNISONE 20 MG PO TABS: 20.0000 mg | ORAL_TABLET | Freq: Every day | ORAL | 0 refills | Status: DC

## 2020-11-30 NOTE — Progress Notes (Signed)
MyChart Video Visit    Virtual Visit via Video Note   This visit type was conducted due to national recommendations for restrictions regarding the COVID-19 Pandemic (e.g. social distancing) in an effort to limit this patient's exposure and mitigate transmission in our community. This patient is at least at moderate risk for complications without adequate follow up. This format is felt to be most appropriate for this patient at this time. Physical exam was limited by quality of the video and audio technology used for the visit.   Patient location: Home Provider location: Home office in [place]  I discussed the limitations of evaluation and management by telemedicine and the availability of in person appointments. The patient expressed understanding and agreed to proceed.  Patient: Krista Holmes   DOB: 2001-03-15   20 y.o. Female  MRN: [id number] Visit Date: 11/30/2020  Today's healthcare provider: Margaretann Loveless, PA-C   No chief complaint on file.  Subjective    HPI  Krista Holmes is a 20 yr old female that presents via mychart video visit for follow up of pneumonia. Patient had covid pneumonia in late August-early September 2021. She reports since she has had a slight cough. It can be worse with lying flat. Would like evaluation prior to her going back to college at [employer].  Patient Active Problem List   Diagnosis Date Noted  . ADHD 08/05/2019  . Anxiety 08/05/2019   Past Medical History:  Diagnosis Date  . Allergy    Seasonal allergies/ sneezing, coughing etc  . GERD (gastroesophageal reflux disease)       Medications: Outpatient Medications Prior to Visit  Medication Sig  . amphetamine-dextroamphetamine (ADDERALL XR) 30 MG 24 hr capsule Take 1 capsule (30 mg total) by mouth daily.  . norethindrone-ethinyl estradiol (JUNEL FE 1/20) 1-20 MG-MCG tablet Take 1 tablet by mouth daily.   No facility-administered medications prior to visit.    Review of  Systems  Constitutional: Negative.   Respiratory: Positive for cough. Negative for chest tightness, shortness of breath and wheezing.   Cardiovascular: Negative.   Neurological: Negative.     Last CBC No results found for: WBC, HGB, HCT, MCV, MCH, RDW, PLT Last metabolic panel No results found for: GLUCOSE, NA, K, CL, CO2, BUN, CREATININE, GFRNONAA, GFRAA, CALCIUM, PHOS, PROT, ALBUMIN, LABGLOB, AGRATIO, BILITOT, ALKPHOS, AST, ALT, ANIONGAP    Objective    There were no vitals taken for this visit. BP Readings from Last 3 Encounters:  04/02/20 117/75  05/22/16 110/70 (58 %, Z = 0.20 /  70 %, Z = 0.52)*   *BP percentiles are based on the 2017 AAP Clinical Practice Guideline for girls   Wt Readings from Last 3 Encounters:  04/02/20 140 lb (63.5 kg) (73 %, Z= 0.60)*  06/21/19 140 lb (63.5 kg) (75 %, Z= 0.68)*  05/22/16 157 lb 12.8 oz (71.6 kg) (93 %, Z= 1.46)*   * Growth percentiles are based on CDC (Girls, 2-20 Years) data.      Physical Exam     Assessment & Plan     1. Cough Persistent since havind covid pneumonia over the fall. Will get CXR to r/o bronchitic changes or scarring from pneumonia that may be sequela of pneumonia. Will f/u pending results. Prednisone will be given as below for possible residual inflammation. Call if symptoms worsen in the meantime.  - DG Chest 2 View; Future - predniSONE (DELTASONE) 20 MG tablet; Take 1 tablet (20 mg total) by mouth daily with  breakfast.  Dispense: 5 tablet; Refill: 0  2. History of COVID-19 See above medical treatment plan. - DG Chest 2 View; Future - predniSONE (DELTASONE) 20 MG tablet; Take 1 tablet (20 mg total) by mouth daily with breakfast.  Dispense: 5 tablet; Refill: 0  3. Attention deficit hyperactivity disorder (ADHD), combined type Stable. Diagnosis pulled for medication refill. Continue current medical treatment plan. - amphetamine-dextroamphetamine (ADDERALL XR) 30 MG 24 hr capsule; Take 1 capsule (30 mg total)  by mouth daily.  Dispense: 30 capsule; Refill: 0   No follow-ups on file.     I discussed the assessment and treatment plan with the patient. The patient was provided an opportunity to ask questions and all were answered. The patient agreed with the plan and demonstrated an understanding of the instructions.   The patient was advised to call back or seek an in-person evaluation if the symptoms worsen or if the condition fails to improve as anticipated.  I provided 17 minutes of face-to-face time during this encounter via MyChart Video enabled encounter.  Delmer Islam, PA-C, have reviewed all documentation for this visit. The documentation on 12/01/20 for the exam, diagnosis, procedures, and orders are all accurate and complete.  Reine Just [facility] [phone] (phone) [phone] (fax)  [facility]

## 2020-12-01 ENCOUNTER — Encounter: Payer: Self-pay | Admitting: Physician Assistant

## 2020-12-04 ENCOUNTER — Telehealth: Payer: Self-pay

## 2020-12-04 NOTE — Telephone Encounter (Signed)
Copied from CRM [id number]. Topic: General - Other >> Dec 04, 2020  1:14 PM Gwenlyn Fudge wrote: Reason for CRM: Pts mother called and is requesting to have results from Chest X Ray. Please advise.

## 2020-12-05 NOTE — Telephone Encounter (Signed)
Krista Honour do you have her results.

## 2020-12-05 NOTE — Telephone Encounter (Signed)
LMTCB-Ok for [facility] nurse to give patient results.

## 2020-12-05 NOTE — Telephone Encounter (Signed)
Krista Holmes,  Your chest xray looks great. No residual pneumonia or scarring.   Daiva Nakayama, [facility]

## 2020-12-07 NOTE — Telephone Encounter (Signed)
Patients mother advised as below.  °

## 2020-12-13 ENCOUNTER — Telehealth (INDEPENDENT_AMBULATORY_CARE_PROVIDER_SITE_OTHER): Payer: 59 | Admitting: Physician Assistant

## 2020-12-13 DIAGNOSIS — J029 Acute pharyngitis, unspecified: Secondary | ICD-10-CM

## 2020-12-13 MED ORDER — PREDNISONE 10 MG PO TABS: 10.0000 mg | ORAL_TABLET | Freq: Every day | ORAL | 0 refills | Status: AC

## 2020-12-13 MED ORDER — AMOXICILLIN 875 MG PO TABS: 875.0000 mg | ORAL_TABLET | Freq: Two times a day (BID) | ORAL | 0 refills | Status: AC

## 2020-12-13 NOTE — Progress Notes (Signed)
MyChart Video Visit    Virtual Visit via Video Note   This visit type was conducted due to national recommendations for restrictions regarding the COVID-19 Pandemic (e.g. social distancing) in an effort to limit this patient's exposure and mitigate transmission in our community. This patient is at least at moderate risk for complications without adequate follow up. This format is felt to be most appropriate for this patient at this time. Physical exam was limited by quality of the video and audio technology used for the visit.   Patient location: Home Provider location: Office  I discussed the limitations of evaluation and management by telemedicine and the availability of in person appointments. The patient expressed understanding and agreed to proceed.  Patient: Krista Holmes   DOB: September 13, 2001   20 y.o. Female  MRN: [id number] Visit Date: 12/13/2020  Today's healthcare provider: Trey Sailors, PA-C   No chief complaint on file.  Subjective    Sore Throat  This is a new problem. The pain is moderate. Associated symptoms include congestion, coughing and headaches. She has tried acetaminophen for the symptoms.    Patient sore throat since Saturday and reports it is worse in the morning and night. She had COVID fall 2021. Currently has multiple contacts. She has an intermittent cough since COVID. She has a history of strep throat and reports white spots on her throat today. Sick contacts with covid and flu.    Medications: Outpatient Medications Prior to Visit  Medication Sig  . amphetamine-dextroamphetamine (ADDERALL XR) 30 MG 24 hr capsule Take 1 capsule (30 mg total) by mouth daily.  . norethindrone-ethinyl estradiol (JUNEL FE 1/20) 1-20 MG-MCG tablet Take 1 tablet by mouth daily.  . predniSONE (DELTASONE) 20 MG tablet Take 1 tablet (20 mg total) by mouth daily with breakfast.   No facility-administered medications prior to visit.    Review of Systems  HENT: Positive  for congestion.   Respiratory: Positive for cough.   Neurological: Positive for headaches.      Objective    LMP 11/14/2020 (Within Weeks)    Physical Exam     Assessment & Plan    1. Sore throat  She is not close to clinic, may be unable to get tested. Would recommend starting with prednisone to see if it relieves symptoms and may proceed to amoxicillin for bacterial infection if sore throat persists.   - predniSONE (DELTASONE) 10 MG tablet; Take 1 tablet (10 mg total) by mouth daily with breakfast for 5 days.  Dispense: 5 tablet; Refill: 0 - amoxicillin (AMOXIL) 875 MG tablet; Take 1 tablet (875 mg total) by mouth 2 (two) times daily for 7 days.  Dispense: 14 tablet; Refill: 0 - COVID-19, Flu A+B and RSV - Culture, Group A Strep   No follow-ups on file.     I discussed the assessment and treatment plan with the patient. The patient was provided an opportunity to ask questions and all were answered. The patient agreed with the plan and demonstrated an understanding of the instructions.   The patient was advised to call back or seek an in-person evaluation if the symptoms worsen or if the condition fails to improve as anticipated.   ITrey Sailors, PA-C, have reviewed all documentation for this visit. The documentation on 12/13/20 for the exam, diagnosis, procedures, and orders are all accurate and complete.  The entirety of the information documented in the History of Present Illness, Review of Systems and Physical Exam were personally obtained  by me. Portions of this information were initially documented by Anson [place], CMA and reviewed by me for thoroughness and accuracy.    Maryella Shivers [facility] [phone] (phone) [phone] (fax)  [facility]

## 2021-01-03 ENCOUNTER — Other Ambulatory Visit: Payer: Self-pay | Admitting: Physician Assistant

## 2021-01-03 DIAGNOSIS — F902 Attention-deficit hyperactivity disorder, combined type: Secondary | ICD-10-CM

## 2021-01-03 MED ORDER — AMPHETAMINE-DEXTROAMPHET ER 30 MG PO CP24: 30.0000 mg | ORAL_CAPSULE | Freq: Every day | ORAL | 0 refills | Status: DC

## 2021-01-03 NOTE — Telephone Encounter (Signed)
Please review for refill. Refill not delegated per protocol. Last filled on 11/30/20 #30

## 2021-01-03 NOTE — Telephone Encounter (Signed)
Medication: amphetamine-dextroamphetamine (ADDERALL XR) 30 MG 24 hr capsule [[id number]]   Has the patient contacted their pharmacy? YES (Agent: If no, request that the patient contact the pharmacy for the refill.) (Agent: If yes, when and what did the pharmacy advise?)  Preferred Pharmacy (with phone number or street name): [id number] [place] [place] - [address] AT [address] [place] [address] Phone: [phone] Fax: [phone] Hours: Not open 24 hours    Agent: Please be advised that RX refills may take up to 3 business days. We ask that you follow-up with your pharmacy.

## 2021-02-07 ENCOUNTER — Other Ambulatory Visit: Payer: Self-pay | Admitting: Family Medicine

## 2021-02-07 DIAGNOSIS — F902 Attention-deficit hyperactivity disorder, combined type: Secondary | ICD-10-CM

## 2021-02-07 MED ORDER — AMPHETAMINE-DEXTROAMPHET ER 30 MG PO CP24: 30.0000 mg | ORAL_CAPSULE | Freq: Every day | ORAL | 0 refills | Status: DC

## 2021-02-07 NOTE — Telephone Encounter (Signed)
Requested medication (s) are due for refill today:yes  Requested medication (s) are on the active medication list: {yes   Last refill: 01/03/21  #30   0 refills  Future visit scheduled  no  Notes to clinic  Not delegated  Requested Prescriptions  Pending Prescriptions Disp Refills   amphetamine-dextroamphetamine (ADDERALL XR) 30 MG 24 hr capsule 30 capsule 0    Sig: Take 1 capsule (30 mg total) by mouth daily.      Not Delegated - Psychiatry:  Stimulants/ADHD Failed - 02/07/2021 10:01 AM      Failed - This refill cannot be delegated      Failed - Urine Drug Screen completed in last 360 days      Passed - Valid encounter within last 3 months    Recent Outpatient Visits           1 month ago Sore throat   [facility], Ricki Rodriguez M, [place]   2 months ago Cough   [facility] Joycelyn Man M, [place]   4 months ago Finger swelling   [facility] Osvaldo Angst M, [place]   5 months ago Encounter for oral contraception initial prescription   [facility] Margaretann Loveless, [place]   6 months ago COVID-19   [facility], Marzella Schlein, [place]

## 2021-02-07 NOTE — Telephone Encounter (Signed)
Copied from CRM [id number]. Topic: Quick Communication - Rx Refill/Question >> Feb 07, 2021  9:33 AM Mcneil, Ja-Kwan wrote: Medication: amphetamine-dextroamphetamine (ADDERALL XR) 30 MG 24 hr capsule  Has the patient contacted their pharmacy? No - Controlled substance  Preferred Pharmacy (with phone number or street name): [id number] [place], [place] - [address] AT [facility] [address]  Phone: [phone]   Fax: [phone]  Agent: Please be advised that RX refills may take up to 3 business days. We ask that you follow-up with your pharmacy.

## 2021-03-18 ENCOUNTER — Other Ambulatory Visit: Payer: Self-pay | Admitting: Family Medicine

## 2021-03-18 DIAGNOSIS — F902 Attention-deficit hyperactivity disorder, combined type: Secondary | ICD-10-CM

## 2021-03-18 NOTE — Telephone Encounter (Signed)
Medication Refill - Medication:   amphetamine-dextroamphetamine (ADDERALL XR) 30 MG 24 hr capsule    Has the patient contacted their pharmacy? Yes.  controlled substance, need approval.    Preferred Pharmacy (with phone number or street name):   [id number] [place], [place] - [address] AT [address]  [address] [place] [address]  Phone:  [phone] Fax:  [phone]   Agent: Please be advised that RX refills may take up to 3 business days. We ask that you follow-up with your pharmacy.

## 2021-03-18 NOTE — Telephone Encounter (Signed)
Requested medication (s) are due for refill today: yes  Requested medication (s) are on the active medication list: yes  Last refill: 02/07/21  Future visit scheduled: no  Notes to clinic:  not delegated; no valid encounter within last 3 months    Requested Prescriptions  Pending Prescriptions Disp Refills   amphetamine-dextroamphetamine (ADDERALL XR) 30 MG 24 hr capsule 30 capsule 0    Sig: Take 1 capsule (30 mg total) by mouth daily. Due for ADHD follow-up in May      Not Delegated - Psychiatry:  Stimulants/ADHD Failed - 03/18/2021  1:45 PM      Failed - This refill cannot be delegated      Failed - Urine Drug Screen completed in last 360 days      Failed - Valid encounter within last 3 months    Recent Outpatient Visits           3 months ago Sore throat   [facility] Osvaldo Angst M, [place]   3 months ago Cough   [facility] Joycelyn Man M, [place]   5 months ago Finger swelling   [facility] Osvaldo Angst M, [place]   6 months ago Encounter for oral contraception initial prescription   [facility] Margaretann Loveless, [place]   7 months ago COVID-19   [facility], Marzella Schlein, MD

## 2021-03-19 MED ORDER — AMPHETAMINE-DEXTROAMPHET ER 30 MG PO CP24: 30.0000 mg | ORAL_CAPSULE | Freq: Every day | ORAL | 0 refills | Status: DC

## 2021-03-19 NOTE — Telephone Encounter (Signed)
Needs appt q4-107m for ADHD check in - can be virtual. Would go ahead and schedule for future for availability. Refill given today

## 2021-04-17 ENCOUNTER — Telehealth: Payer: Self-pay
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
## 2021-04-18 IMAGING — CR DG CHEST 2V
3 series · 3 of 3 positions shown · non-contrast
Comparison: None.

CLINICAL DATA: COVID short of breath

EXAM:
CHEST - 2 VIEW

[chest pa]
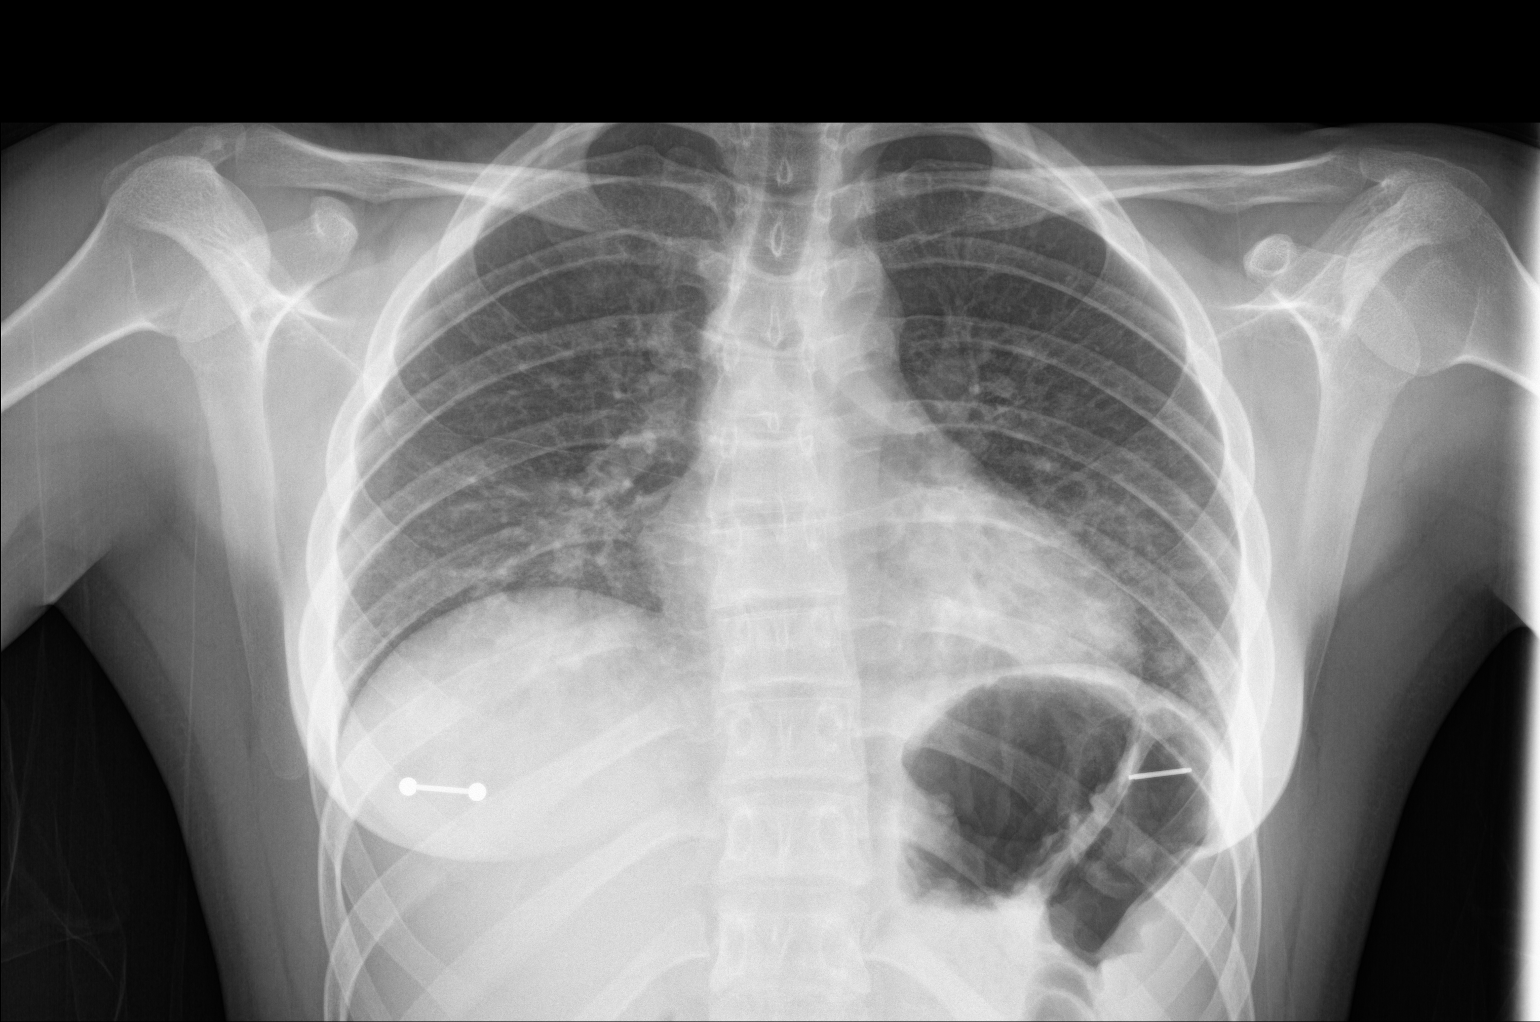

[chest lat (1 of 2)]
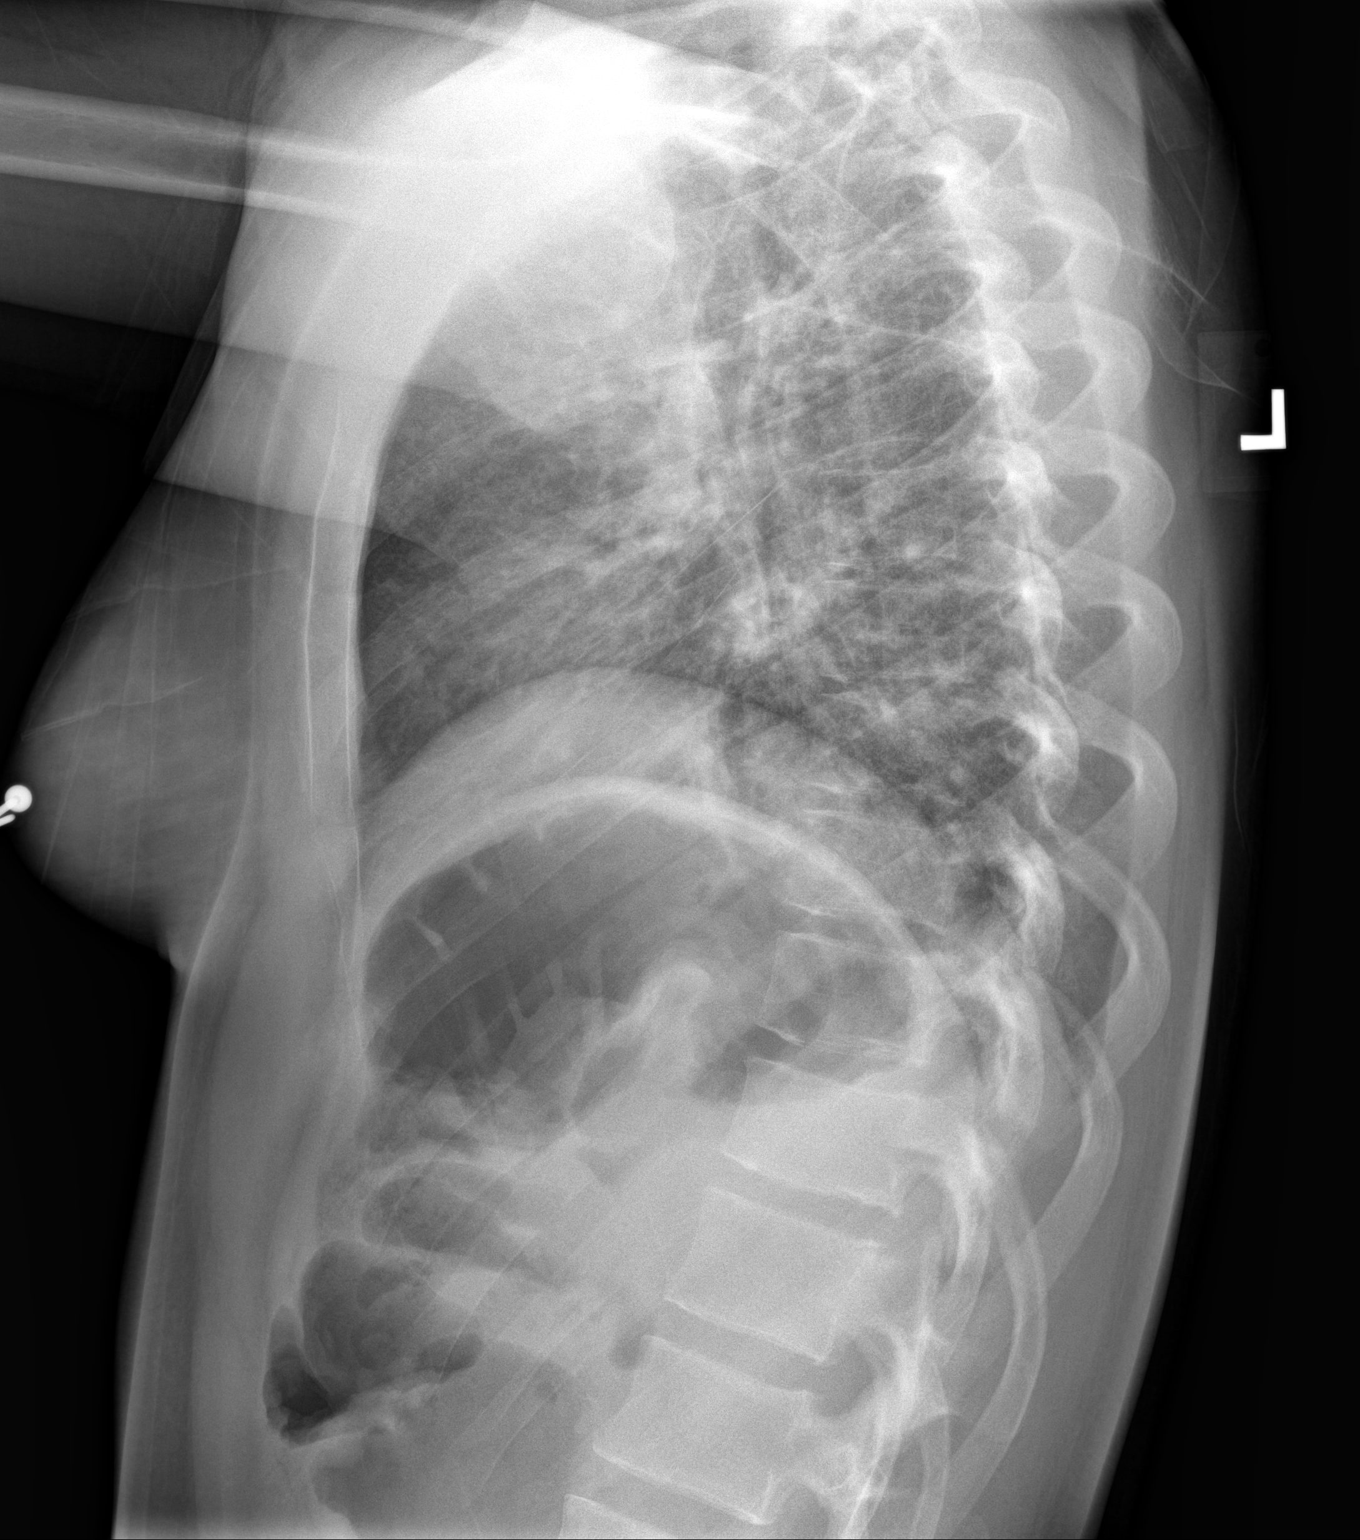

[chest lat (2 of 2)]
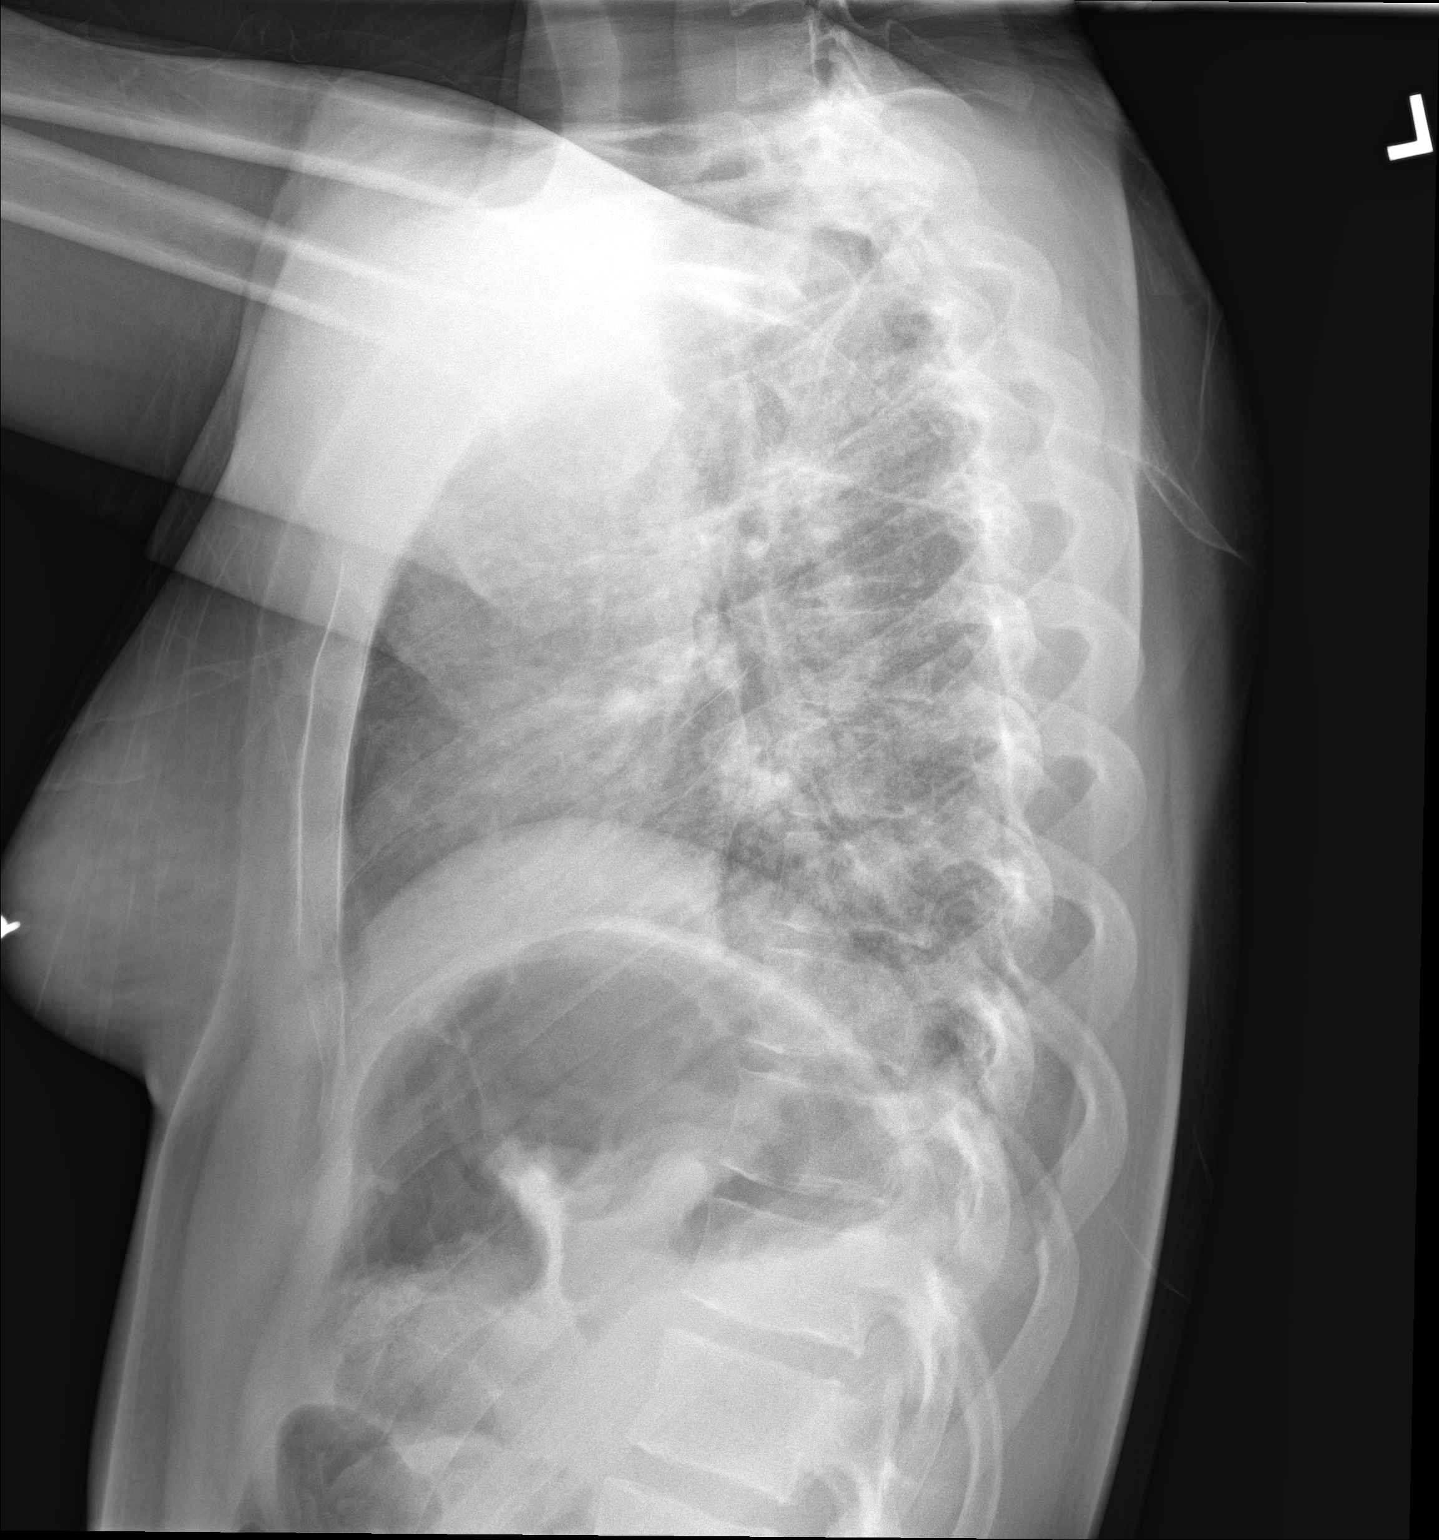

[3 of 3 positions shown; findings below may reference images not displayed]

FINDINGS: Low lung volumes. Left greater than right airspace disease. Normal
heart size. No pneumothorax. No pleural effusion
IMPRESSION: Low lung volumes. Left greater than right lower lobe airspace
disease consistent with bilateral pneumonia

## 2021-04-18 NOTE — Telephone Encounter (Signed)
Patients mother was advised. KW 
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
## 2021-04-18 NOTE — Telephone Encounter (Signed)
Agree that this needs to be evaluated and consider UC if no appts available (can offer [place] or [facility])

## 2021-04-23 ENCOUNTER — Encounter: Payer: Self-pay | Admitting: Advanced Practice Midwife

## 2021-04-23 ENCOUNTER — Other Ambulatory Visit: Payer: Self-pay

## 2021-04-23 ENCOUNTER — Ambulatory Visit: Payer: 59 | Admitting: Advanced Practice Midwife

## 2021-04-23 VITALS — BP 122/78 | HR 88 | Ht 64.0 in | Wt 160.0 lb

## 2021-04-23 DIAGNOSIS — Q524 Other congenital malformations of vagina: Secondary | ICD-10-CM

## 2021-04-23 DIAGNOSIS — R102 Pelvic and perineal pain: Secondary | ICD-10-CM

## 2021-04-23 NOTE — Progress Notes (Signed)
Patient ID: Krista Holmes, female   DOB: 2001-08-29, 20 y.o.   MRN: [id number]  Reason for Consult: Gynecologic Exam (Having cramps unrelated to period. Issues with tampon getting stuck.)   Referred by Erasmo Downer, MD  Subjective:  HPI:  Krista Holmes is a 20 y.o. female being seen for pelvic/abdominal pain that is unrelated to her cycle. The pain is crampy and random and occurs 2-3 times per month. It lasts for a short time when it happens. She also has painful periods and pain for a day after intercourse. She denies GI problems. We discussed pain associated with ovulation and the possibility of endometriosis. She uses OCP for birth control/occasional condom use, and denies concern for STDs.  She reports difficulty removing tampons due to vaginal tissue that gets in the way and is requesting evaluation for the same. She first noticed the issue in middle school. She denies any trauma to the area. After evaluating the affected vaginal tissue, I suggested she schedule with one of the doctors to have it repaired with a minor surgical procedure- most likely in the office. She does not want to have any procedure today.  Past Medical History:  Diagnosis Date  . Allergy    Seasonal allergies/ sneezing, coughing etc  . Dysmenorrhea   . GERD (gastroesophageal reflux disease)    Family History  Problem Relation Age of Onset  . Heart attack Maternal Grandmother    Past Surgical History:  Procedure Laterality Date  . NO PAST SURGERIES      Short Social History:  Social History   Tobacco Use  . Smoking status: Never Smoker  . Smokeless tobacco: Never Used  Substance Use Topics  . Alcohol use: Yes    Comment: occasional    Allergies  Allergen Reactions  . Other Hives    Red Hi-C  . Omnicef [Cefdinir] Rash  . Zithromax [Azithromycin] Rash    Current Outpatient Medications  Medication Sig Dispense Refill  . amphetamine-dextroamphetamine (ADDERALL XR) 30 MG 24 hr  capsule Take 1 capsule (30 mg total) by mouth daily. Due for ADHD follow-up in May 30 capsule 0  . norethindrone-ethinyl estradiol (JUNEL FE 1/20) 1-20 MG-MCG tablet Take 1 tablet by mouth daily. 84 tablet 4  . silver sulfADIAZINE (SILVADENE) 1 % cream Apply topically 2 (two) times daily.     No current facility-administered medications for this visit.    Review of Systems  Constitutional: Negative for chills and fever.  HENT: Negative for congestion, ear discharge, ear pain, hearing loss, sinus pain and sore throat.   Eyes: Negative for blurred vision and double vision.  Respiratory: Negative for cough, shortness of breath and wheezing.   Cardiovascular: Negative for chest pain, palpitations and leg swelling.  Gastrointestinal: Positive for abdominal pain. Negative for blood in stool, constipation, diarrhea, heartburn, melena, nausea and vomiting.  Genitourinary: Negative for dysuria, flank pain, frequency, hematuria and urgency.       Positive for concern regarding vaginal tissue that interferes with tampon use  Musculoskeletal: Negative for back pain, joint pain and myalgias.  Skin: Negative for itching and rash.  Neurological: Negative for dizziness, tingling, tremors, sensory change, speech change, focal weakness, seizures, loss of consciousness, weakness and headaches.  Endo/Heme/Allergies: Negative for environmental allergies. Does not bruise/bleed easily.  Psychiatric/Behavioral: Negative for depression, hallucinations, memory loss, substance abuse and suicidal ideas. The patient is not nervous/anxious and does not have insomnia.         Objective:  Objective   Vitals:  04/23/21 1336  BP: 122/78  Pulse: 88  Weight: 160 lb (72.6 kg)  Height: 5\' 4"  (1.626 m)   Body mass index is 27.46 kg/m. Constitutional: Well nourished, well developed female in no acute distress.  HEENT: normal Skin: Warm and dry.  Cardiovascular: Regular rate and rhythm.   Extremity: no edema   Respiratory: Clear to auscultation bilateral. Normal respiratory effort Abdomen: soft, nontender, nondistended, no abnormal masses Neuro: DTRs 2+, Cranial nerves grossly intact Psych: Alert and Oriented x3. No memory deficits. Normal mood and affect.  MS: normal gait, normal bilateral lower extremity ROM/strength/stability.  Pelvic exam:  is not limited by body habitus EGBUS: within normal limits Vagina: right side of introitus with loop of what appears to be part of hymen that may have torn and healed as an extra opening   Assessment/Plan:     20 y.o. 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

## 2021-04-23 NOTE — Progress Notes (Signed)
Having cramps unrelated to period. Issues with tampon getting stuck.

## 2021-04-29 ENCOUNTER — Other Ambulatory Visit: Payer: Self-pay | Admitting: Family Medicine

## 2021-04-29 DIAGNOSIS — F902 Attention-deficit hyperactivity disorder, combined type: Secondary | ICD-10-CM

## 2021-04-29 MED ORDER — AMPHETAMINE-DEXTROAMPHET ER 30 MG PO CP24: 30.0000 mg | ORAL_CAPSULE | Freq: Every day | ORAL | 0 refills | Status: DC

## 2021-04-29 NOTE — Telephone Encounter (Signed)
Pt requesting refill for amphetamine-dextroamphetamine (ADDERALL XR) 30 MG 24 hr capsule   [id number] [address], [place] [facility] [address]  Dr. Leonard Schwartz on vacation and next available with Maurine Minister is two weeks away. Pt is already out of medication. They would like to know if medication can be refilled until Dr. Senaida Lange return. Please advise.  Callback:[phone]

## 2021-06-20 ENCOUNTER — Other Ambulatory Visit: Payer: Self-pay

## 2021-06-20 ENCOUNTER — Ambulatory Visit: Payer: 59 | Admitting: Family Medicine

## 2021-06-20 ENCOUNTER — Ambulatory Visit: Payer: Self-pay | Admitting: Family Medicine

## 2021-06-20 VITALS — BP 116/85 | HR 89 | Temp 97.9°F | Wt 161.0 lb

## 2021-06-20 DIAGNOSIS — F902 Attention-deficit hyperactivity disorder, combined type: Secondary | ICD-10-CM | POA: Diagnosis not present

## 2021-06-20 DIAGNOSIS — F419 Anxiety disorder, unspecified: Secondary | ICD-10-CM | POA: Diagnosis not present

## 2021-06-20 DIAGNOSIS — Z30011 Encounter for initial prescription of contraceptive pills: Secondary | ICD-10-CM

## 2021-06-20 MED ORDER — AMPHETAMINE-DEXTROAMPHET ER 30 MG PO CP24: 30.0000 mg | ORAL_CAPSULE | ORAL | 0 refills | Status: DC

## 2021-06-20 MED ORDER — AMPHETAMINE-DEXTROAMPHET ER 30 MG PO CP24: 30.0000 mg | ORAL_CAPSULE | Freq: Every day | ORAL | 0 refills | Status: DC

## 2021-06-20 MED ORDER — NORETHIN ACE-ETH ESTRAD-FE 1-20 MG-MCG PO TABS: 1.0000 | ORAL_TABLET | Freq: Every day | ORAL | 4 refills | Status: DC

## 2021-06-20 NOTE — Progress Notes (Signed)
Established patient visit   Patient: Krista Holmes   DOB: 2001-05-28   20 y.o. Female  MRN: [id number] Visit Date: 06/20/2021  Today's healthcare provider: Shirlee Latch, MD   Chief Complaint  Patient presents with   ADHD    Subjective    HPI   Patient is a 20 year old female who presents for follow up of ADD.  She was last seen for an acute visit on 12/13/20.  She has had multiple acute visits but none specifically for ADD since 09/2019.  ADD She states she has not been taking it recently due to being out of school.  She will be going back to [employer] on 07/04/21.  She reports that she thinks the medication helps for most of the days. When she was taking the adderall consistently she felt that should would feel tired after a few hours taking the medication.   Anxiety  She reports he anxiety has been the same. She sees a counselor weekly and she has considered anxiety medication however her symptoms have been manageable.  Birth Control  She was taking birth control for uncontrolled periods and acne. She reports that she has noticed she gained weight and acne improved some. Also her menstrual cycle is now controlled and her cramps are less severe.      Medications: Outpatient Medications Prior to Visit  Medication Sig   [DISCONTINUED] amphetamine-dextroamphetamine (ADDERALL XR) 30 MG 24 hr capsule Take 1 capsule (30 mg total) by mouth daily. Due for ADHD follow-up in May   [DISCONTINUED] norethindrone-ethinyl estradiol (JUNEL FE 1/20) 1-20 MG-MCG tablet Take 1 tablet by mouth daily.   [DISCONTINUED] silver sulfADIAZINE (SILVADENE) 1 % cream Apply topically 2 (two) times daily.   No facility-administered medications prior to visit.   Review of Systems  Constitutional:  Negative for chills, fatigue, fever and unexpected weight change.  HENT:  Negative for ear pain, sinus pressure, sinus pain and sore throat.   Eyes:  Negative for pain and visual disturbance.   Respiratory:  Negative for cough, chest tightness, shortness of breath and wheezing.   Cardiovascular:  Negative for chest pain, palpitations and leg swelling.  Gastrointestinal:  Negative for abdominal pain, blood in stool, diarrhea, nausea and vomiting.  Genitourinary:  Negative for flank pain, frequency, pelvic pain and urgency.  Musculoskeletal:  Negative for back pain, myalgias and neck pain.  Neurological:  Negative for facial asymmetry, weakness, light-headedness, numbness and headaches.  Psychiatric/Behavioral:  Negative for agitation, behavioral problems, confusion, decreased concentration, dysphoric mood, hallucinations, self-injury and sleep disturbance. The patient is nervous/anxious. The patient is not hyperactive.       Objective    BP 116/85 (BP Location: Left Arm, Patient Position: Sitting, Cuff Size: Normal)   Pulse 89   Temp 97.9 F (36.6 C)   Wt 161 lb (73 kg)   SpO2 100%   BMI 27.64 kg/m     Physical Exam Vitals reviewed.  Constitutional:      General: She is not in acute distress.    Appearance: Normal appearance. She is well-developed. She is not diaphoretic.  HENT:     Head: Normocephalic and atraumatic.  Eyes:     General: No scleral icterus.    Conjunctiva/sclera: Conjunctivae normal.  Neck:     Thyroid: No thyromegaly.  Cardiovascular:     Rate and Rhythm: Normal rate and regular rhythm.     Pulses: Normal pulses.     Heart sounds: Normal heart sounds. No murmur heard.  Pulmonary:     Effort: Pulmonary effort is normal. No respiratory distress.     Breath sounds: Normal breath sounds. No wheezing, rhonchi or rales.  Musculoskeletal:     Cervical back: Neck supple.     Right lower leg: No edema.     Left lower leg: No edema.  Lymphadenopathy:     Cervical: No cervical adenopathy.  Skin:    General: Skin is warm and dry.     Findings: No rash.  Neurological:     Mental Status: She is alert and oriented to person, place, and time. Mental  status is at baseline.  Psychiatric:        Mood and Affect: Mood normal.        Behavior: Behavior normal.    No results found for any visits on 06/20/21.  Assessment & Plan     Problem List Items Addressed This Visit       Other   ADHD - Primary    Chronic and well controlled Continue adderall XR at current dose Refilled x29m F/u in 46m       Relevant Medications   amphetamine-dextroamphetamine (ADDERALL XR) 30 MG 24 hr capsule   Anxiety    chroinc and stable Not on medications Continue therapy       Other Visit Diagnoses     Encounter for prescription of oral contraceptives       Relevant Medications   norethindrone-ethinyl estradiol-FE (JUNEL FE 1/20) 1-20 MG-MCG tablet     - refilled OCPs - discussed other options - perfers to continue with current   Return in about 4 months (around 10/21/2021) for ADD f/u, virtual ok.      I,Essence Turner,acting as a Neurosurgeon for Shirlee Latch, MD.,have documented all relevant documentation on the behalf of Shirlee Latch, MD,as directed by  Shirlee Latch, MD while in the presence of Shirlee Latch, MD.  I, Shirlee Latch, MD, have reviewed all documentation for this visit. The documentation on 06/21/21 for the exam, diagnosis, procedures, and orders are all accurate and complete.   Xavius Spadafore, Marzella Schlein, MD, MPH [facility]

## 2021-06-21 ENCOUNTER — Encounter: Payer: Self-pay | Admitting: Family Medicine

## 2021-06-21 NOTE — Assessment & Plan Note (Signed)
Chronic and well controlled Continue adderall XR at current dose Refilled x36m F/u in 76m

## 2021-06-21 NOTE — Assessment & Plan Note (Signed)
chroinc and stable Not on medications Continue therapy

## 2021-07-08 ENCOUNTER — Ambulatory Visit: Payer: Self-pay | Admitting: Family Medicine

## 2021-07-09 ENCOUNTER — Other Ambulatory Visit: Payer: Self-pay | Admitting: Family Medicine

## 2021-07-09 DIAGNOSIS — Z30011 Encounter for initial prescription of contraceptive pills: Secondary | ICD-10-CM

## 2021-07-09 MED ORDER — NORETHIN ACE-ETH ESTRAD-FE 1-20 MG-MCG PO TABS: 1.0000 | ORAL_TABLET | Freq: Every day | ORAL | 4 refills | Status: DC

## 2021-07-09 NOTE — Telephone Encounter (Signed)
Copied from CRM [id number]. Topic: Quick Communication - Rx Refill/Question >> Jul 09, 2021 11:59 AM Gaetana Michaelis A wrote: Medication: norethindrone-ethinyl estradiol-FE (JUNEL FE 1/20) 1-20 MG-MCG tablet   Has the patient contacted their pharmacy? Yes.   (Agent: If no, request that the patient contact the pharmacy for the refill.) (Agent: If yes, when and what did the pharmacy advise?)  Preferred Pharmacy (with phone number or street name): [id number] [place], [place] - [address] AT [facility] [address]  Phone:  [phone] Fax:  [phone]  Agent: Please be advised that RX refills may take up to 3 business days. We ask that you follow-up with your pharmacy.

## 2021-07-25 ENCOUNTER — Other Ambulatory Visit: Payer: Self-pay | Admitting: Family Medicine

## 2021-07-25 DIAGNOSIS — F902 Attention-deficit hyperactivity disorder, combined type: Secondary | ICD-10-CM

## 2021-07-25 MED ORDER — AMPHETAMINE-DEXTROAMPHET ER 30 MG PO CP24: 30.0000 mg | ORAL_CAPSULE | Freq: Every day | ORAL | 0 refills | Status: DC

## 2021-07-25 NOTE — Telephone Encounter (Signed)
Requested medication (s) are due for refill today: Yes  Requested medication (s) are on the active medication list: Yes  Last refill:  06/20/21  Future visit scheduled: Yes  Notes to clinic:  See request.    Requested Prescriptions  Pending Prescriptions Disp Refills   amphetamine-dextroamphetamine (ADDERALL XR) 30 MG 24 hr capsule 30 capsule 0    Sig: Take 1 capsule (30 mg total) by mouth daily.     Not Delegated - Psychiatry:  Stimulants/ADHD Failed - 07/25/2021 10:33 AM      Failed - This refill cannot be delegated      Failed - Urine Drug Screen completed in last 360 days      Passed - Valid encounter within last 3 months    Recent Outpatient Visits           1 month ago Attention deficit hyperactivity disorder (ADHD), combined type   [facility], Marzella Schlein, MD   7 months ago Sore throat   [facility] Osvaldo Angst M, [place]   7 months ago Cough   [facility] Joycelyn Man M, [place]   9 months ago Finger swelling   [facility] Osvaldo Angst M, [place]   10 months ago Encounter for oral contraception initial prescription   [facility], Alessandra Bevels, [place]       Future Appointments             In 3 months Bacigalupo, Marzella Schlein, MD [facility]

## 2021-07-25 NOTE — Telephone Encounter (Addendum)
Patient last seen 06/20/2021 by PCP   Medication Refill - Medication: amphetamine-dextroamphetamine (ADDERALL XR) 30 MG 24 hr capsule  Has the patient contacted their pharmacy? No.Caller states every time she calls the pharmacy "it never worksDoctor, hospital (with phone number or street name):   [id number] [place], [place] - [address] AT [facility] [address] Phone:  [phone]  Fax:  [phone]      Agent: Please be advised that RX refills may take up to 3 business days. We ask that you follow-up with your pharmacy.

## 2021-08-24 IMAGING — CR DG CHEST 2V
1 series · 2 of 2 positions shown · non-contrast
Comparison: 07/25/2020

CLINICAL DATA: Cough.  Recent [id number] virus infection.

EXAM:
CHEST - 2 VIEW

[Series 1: dg chest 2 view · 0.14mm/px · 2 of 2 slices shown]
[im 1/2]
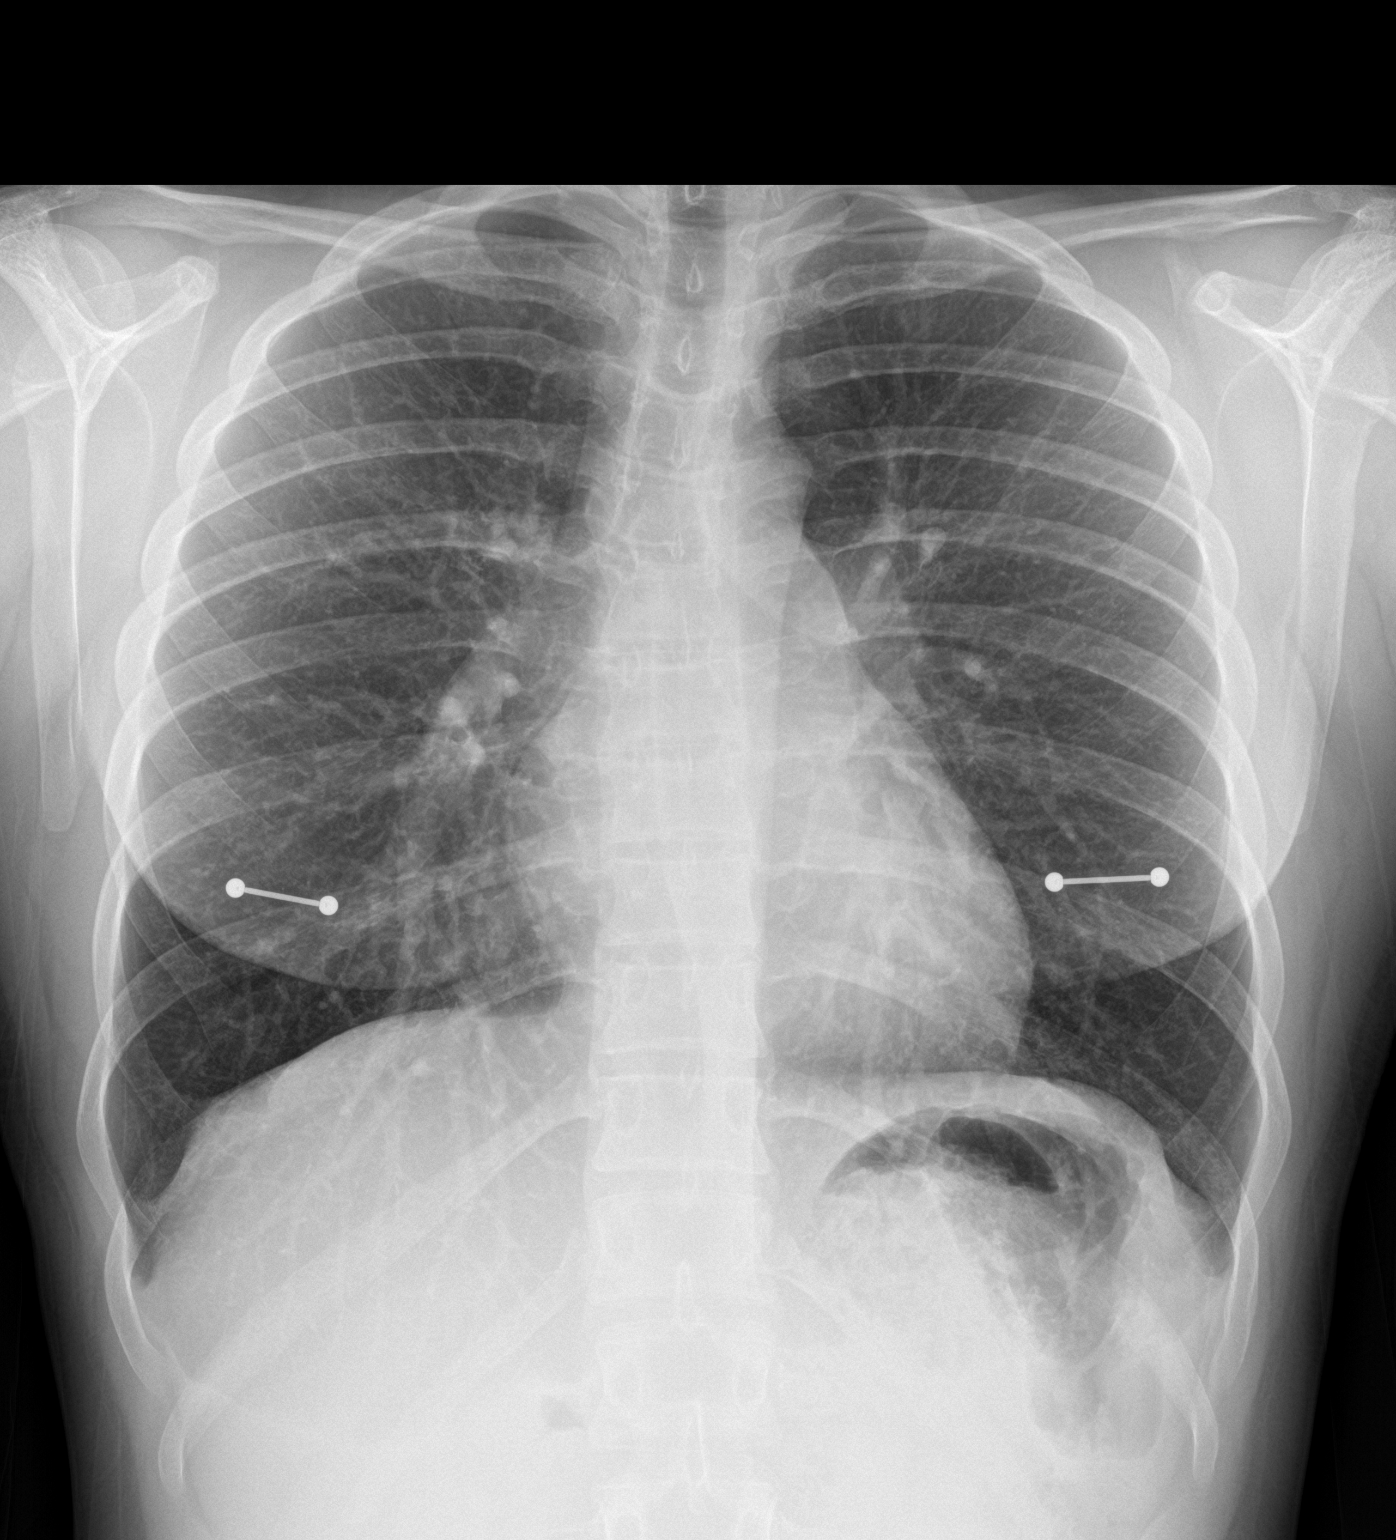
[im 2/2]
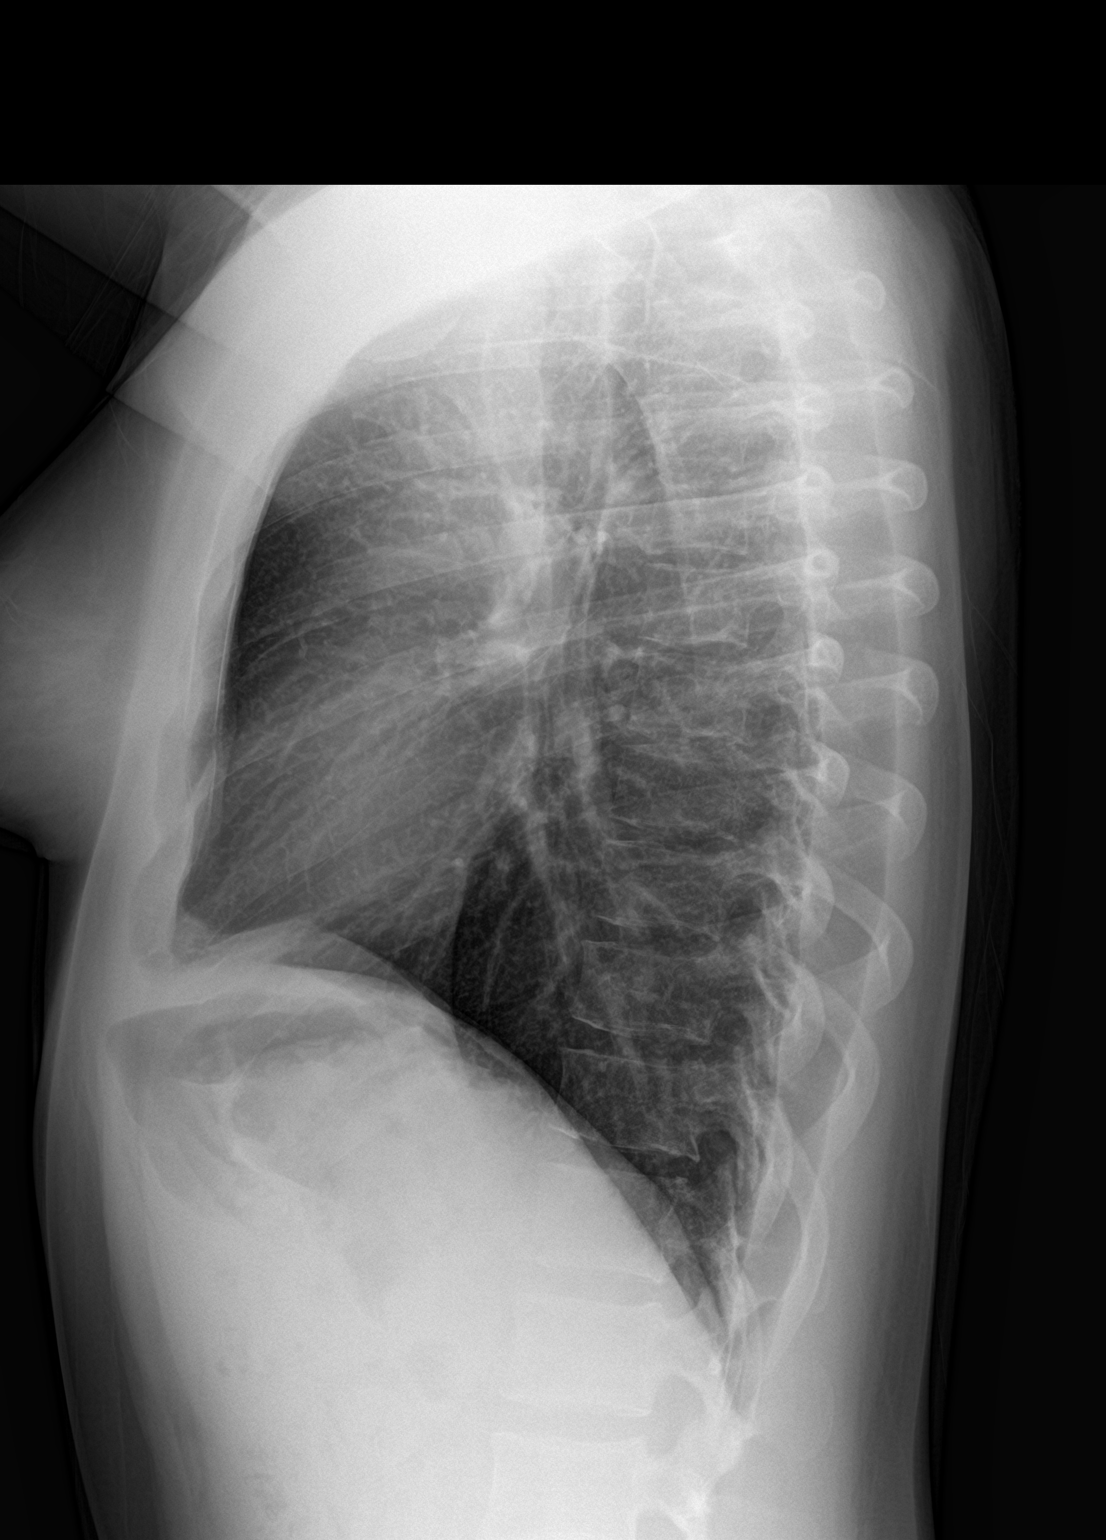

[2 of 2 positions shown; findings below may reference images not displayed]

FINDINGS: The heart size and mediastinal contours are within normal limits.
Both lungs are well aerated and clear. The visualized skeletal
structures are unremarkable.
IMPRESSION: Normal study.

## 2021-08-27 ENCOUNTER — Other Ambulatory Visit: Payer: Self-pay | Admitting: Family Medicine

## 2021-08-27 DIAGNOSIS — F902 Attention-deficit hyperactivity disorder, combined type: Secondary | ICD-10-CM

## 2021-08-27 MED ORDER — AMPHETAMINE-DEXTROAMPHET ER 30 MG PO CP24: 30.0000 mg | ORAL_CAPSULE | Freq: Every day | ORAL | 0 refills | Status: DC

## 2021-08-27 NOTE — Telephone Encounter (Signed)
Pt mom called for a refill on Adderall 30 mg  [facility] [place]  [place]

## 2021-08-27 NOTE — Telephone Encounter (Signed)
Please advise refill?  LOV: 06/20/2021 NOV: 10/28/2021 Last refill: 07/25/2021 #30 w/0 refills

## 2021-09-18 ENCOUNTER — Ambulatory Visit: Payer: 59 | Admitting: Dermatology

## 2021-09-18 ENCOUNTER — Encounter: Payer: Self-pay | Admitting: Dermatology

## 2021-09-18 ENCOUNTER — Other Ambulatory Visit: Payer: Self-pay

## 2021-09-18 DIAGNOSIS — L700 Acne vulgaris: Secondary | ICD-10-CM

## 2021-09-18 NOTE — Progress Notes (Signed)
   New Patient Visit  Subjective  Krista Holmes is a 20 y.o. female who presents for the following: Acne (Patient using CeraVe products daily, PanOxyl 4% three times weekly, azelaic acid a few times a week. Patient has taken doxycycline and used prescriptions for acne in the past. Patient is on Junel. Acne mostly at cheeks and chest. Patient does not eat dairy or sugar. ).  Patient accompanied by mother. Patient advises there is not much difference in acne with periods.   The following portions of the chart were reviewed this encounter and updated as appropriate:   Tobacco  Allergies  Meds  Problems  Med Hx  Surg Hx  Fam Hx      Review of Systems:  No other skin or systemic complaints except as noted in HPI or Assessment and Plan.  Objective  Well appearing patient in no apparent distress; mood and affect are within normal limits.  A focused examination was performed including face, neck, chest and back. Relevant physical exam findings are noted in the Assessment and Plan.  face Face with single, very small scar at left jaw, few inflammatory papules Chest with a few small scars, trace open comedones, few inflammatory papules Back with trace open comedones   Assessment & Plan  Acne vulgaris face  Chronic condition with duration or expected duration over one year. Condition is bothersome to patient. Currently flared. With scarring  Reviewed potential side effects of isotretinoin including xerosis, cheilitis, hepatitis, hyperlipidemia, and severe birth defects if taken by a pregnant woman. Reviewed reports of suicidal ideation in those with a history of depression while taking isotretinoin and reports of diagnosis of inflammatory bowl disease while taking isotretinoin as well as the lack of evidence for a causal relationship between isotretinoin, depression and IBD. Patient advised to reach out with any questions or concerns. Patient advised not to share pills or donate blood  while on treatment or for one month after completing treatment.  Will start Absorica 20 mg pending normal labs in 30 days.  [facility] Oral BP pills/condoms iPledge # [phone] [email or web address]  Urine pregnancy test performed in office today and was negative.  Patient demonstrates comprehension and confirms she will not get pregnant.   Patient registered in iPledge program.     Return in about 30 days (around 10/18/2021) for Isotretinoin start.  Anise Salvo, RMA, am acting as scribe for Darden Dates, MD .  Documentation: I have reviewed the above documentation for accuracy and completeness, and I agree with the above.  Darden Dates, MD

## 2021-09-18 NOTE — Patient Instructions (Addendum)
Reviewed potential side effects of isotretinoin including xerosis, cheilitis, hepatitis, hyperlipidemia, and severe birth defects if taken by a pregnant woman. Reviewed reports of suicidal ideation in those with a history of depression while taking isotretinoin and reports of diagnosis of inflammatory bowl disease while taking isotretinoin as well as the lack of evidence for a causal relationship between isotretinoin, depression and IBD. Patient advised to reach out with any questions or concerns. Patient advised not to share pills or donate blood while on treatment or for one month after completing treatment.     If you have any questions or concerns for your doctor, please call our main line at [phone] and press option 4 to reach your doctor's medical assistant. If no one answers, please leave a voicemail as directed and we will return your call as soon as possible. Messages left after 4 pm will be answered the following business day.   You may also send us a message via MyChart. We typically respond to MyChart messages within 1-2 business days.  For prescription refills, please ask your pharmacy to contact our office. Our fax number is [phone].  If you have an urgent issue when the clinic is closed that cannot wait until the next business day, you can page your doctor at the number below.    Please note that while we do our best to be available for urgent issues outside of office hours, we are not available 24/7.   If you have an urgent issue and are unable to reach us, you may choose to seek medical care at your doctor's office, retail clinic, urgent care center, or emergency room.  If you have a medical emergency, please immediately call 911 or go to the emergency department.  Pager Numbers  - Dr. Kowalski: [phone]  - Dr. Moye: [phone]  - Dr. Stewart: [phone]  In the event of inclement weather, please call our main line at [phone] for an update on the  status of any delays or closures.  Dermatology Medication Tips: Please keep the boxes that topical medications come in in order to help keep track of the instructions about where and how to use these. Pharmacies typically print the medication instructions only on the boxes and not directly on the medication tubes.   If your medication is too expensive, please contact our office at [phone] option 4 or send us a message through MyChart.   We are unable to tell what your co-pay for medications will be in advance as this is different depending on your insurance coverage. However, we may be able to find a substitute medication at lower cost or fill out paperwork to get insurance to cover a needed medication.   If a prior authorization is required to get your medication covered by your insurance company, please allow us 1-2 business days to complete this process.  Drug prices often vary depending on where the prescription is filled and some pharmacies may offer cheaper prices.  The website www.goodrx.com contains coupons for medications through different pharmacies. The prices here do not account for what the cost may be with help from insurance (it may be cheaper with your insurance), but the website can give you the price if you did not use any insurance.  - You can print the associated coupon and take it with your prescription to the pharmacy.  - You may also stop by our office during regular business hours and pick up a GoodRx coupon card.  - If you need your prescription   sent electronically to a different pharmacy, notify our office through [place] MyChart or by phone at [phone] option 4.  

## 2021-09-30 ENCOUNTER — Other Ambulatory Visit: Payer: Self-pay | Admitting: Family Medicine

## 2021-09-30 NOTE — Telephone Encounter (Signed)
LOV: 06/20/2021   NOV: 10/28/2021   Last Refill: 08/27/2021 #30 0 Refills.   Thanks,   -Vernona Rieger

## 2021-09-30 NOTE — Telephone Encounter (Signed)
Medication Refill - Medication:amphetamine-dextroamphetamine (ADDERALL XR) 30 MG 24 hr capsule  Has the patient contacted their pharmacy? yes (Agent: If no, request that the patient contact the pharmacy for the refill. If patient does not wish to contact the pharmacy document the reason why and proceed with request.) (Agent: If yes, when and what did the pharmacy advise?)contact pcp  Preferred Pharmacy (with phone number or street name): [id number] [place], [place] - [address] AT [address] Has the patient been seen for an appointment in the last year OR does the patient have an upcoming appointment? yes  Agent: Please be advised that RX refills may take up to 3 business days. We ask that you follow-up with your pharmacy.

## 2021-10-01 ENCOUNTER — Other Ambulatory Visit: Payer: Self-pay | Admitting: Family Medicine

## 2021-10-01 DIAGNOSIS — F902 Attention-deficit hyperactivity disorder, combined type: Secondary | ICD-10-CM

## 2021-10-01 MED ORDER — AMPHETAMINE-DEXTROAMPHET ER 30 MG PO CP24: 30.0000 mg | ORAL_CAPSULE | ORAL | 0 refills | Status: DC

## 2021-10-01 NOTE — Telephone Encounter (Signed)
LOV: 06/20/2021  NOV: 10/28/2021  Last Refill: 08/27/2021  #30 0 Refill   Thanks,   Vernona Rieger

## 2021-10-02 ENCOUNTER — Other Ambulatory Visit: Payer: Self-pay | Admitting: Family Medicine

## 2021-10-02 DIAGNOSIS — F902 Attention-deficit hyperactivity disorder, combined type: Secondary | ICD-10-CM

## 2021-10-03 ENCOUNTER — Other Ambulatory Visit: Payer: Self-pay

## 2021-10-03 MED ORDER — AMPHETAMINE-DEXTROAMPHET ER 30 MG PO CP24: 30.0000 mg | ORAL_CAPSULE | Freq: Every day | ORAL | 0 refills | Status: DC

## 2021-10-03 NOTE — Addendum Note (Signed)
Addended by: Marjie Skiff on: 10/03/2021 10:12 AM   Modules accepted: Orders

## 2021-10-03 NOTE — Telephone Encounter (Signed)
Prescription was sent in Yesterday to [facility]. Patient requesting [facility] [place]. Please  amphetamine-dextroamphetamine (ADDERALL XR) 30 MG 24 hr capsule

## 2021-10-03 NOTE — Telephone Encounter (Signed)
Copied from CRM [id number]. Topic: General - Other >> Oct 03, 2021  8:36 AM Jaquita Rector A wrote: Reason for CRM: Patient mom calledin to say that she is at school in [place] and [id number] [place], [place] - [address] AT [facility] [address]  Phone:  [phone] Fax:  [phone]

## 2021-10-24 ENCOUNTER — Ambulatory Visit: Payer: 59 | Admitting: Dermatology

## 2021-10-24 ENCOUNTER — Telehealth: Payer: Self-pay

## 2021-10-24 NOTE — Telephone Encounter (Signed)
Patient had appointment today at 2:20 for accutane. Patient's mother called to advise patient is in [place] and could not make appt until 3:30pm. Patient was suppose to have labs done after last months appointment and pending lab results, we were going to send in Absorica for patient to start accutane. Patient never had labs done so I cancelled patient's appt today and suggested she go have her labs done tomorrow or Monday and pending those results we could then send in medicine to [facility] to get her started on accutane. Patient was registered in iPledge 09/18/2021./js

## 2021-10-28 ENCOUNTER — Telehealth (INDEPENDENT_AMBULATORY_CARE_PROVIDER_SITE_OTHER): Payer: 59 | Admitting: Family Medicine

## 2021-10-28 DIAGNOSIS — Z91199 Patient's noncompliance with other medical treatment and regimen due to unspecified reason: Secondary | ICD-10-CM

## 2021-10-28 NOTE — Progress Notes (Signed)
Unable to reach patient by phone x3. She never connected to virtual visit. No show.

## 2021-11-07 ENCOUNTER — Other Ambulatory Visit: Payer: Self-pay | Admitting: Family Medicine

## 2021-11-07 MED ORDER — AMPHETAMINE-DEXTROAMPHET ER 30 MG PO CP24: 30.0000 mg | ORAL_CAPSULE | ORAL | 0 refills | Status: DC

## 2021-11-07 NOTE — Telephone Encounter (Signed)
LOV: 06/20/2021  NOV: None  Last Refill:  10/03/2021 #30 0 Refills   Thanks,   -Vernona Rieger

## 2021-11-07 NOTE — Telephone Encounter (Signed)
Mom Alcario Drought calling to advise pt is ome from college and needs to pick up her  amphetamine-dextroamphetamine (ADDERALL XR) 30 MG 24 hr capsule  [id number] [place] - [address]  Pt's current Rx is [place], [place].  The pharmacy cannot transfer this Rx for her.  Can [facility] please [facility] a [facility]?

## 2021-11-19 ENCOUNTER — Other Ambulatory Visit: Payer: Self-pay

## 2021-11-19 DIAGNOSIS — Z30011 Encounter for initial prescription of contraceptive pills: Secondary | ICD-10-CM

## 2021-11-19 MED ORDER — NORETHIN ACE-ETH ESTRAD-FE 1-20 MG-MCG PO TABS: 1.0000 | ORAL_TABLET | Freq: Every day | ORAL | 0 refills | Status: DC

## 2021-11-19 NOTE — Telephone Encounter (Signed)
Requested Prescriptions  Pending Prescriptions Disp Refills   norethindrone-ethinyl estradiol-FE (JUNEL FE 1/20) 1-20 MG-MCG tablet 84 tablet 0    Sig: Take 1 tablet by mouth daily.     OB/GYN:  Contraceptives Passed - 11/19/2021  5:37 PM      Passed - Last BP in normal range    BP Readings from Last 1 Encounters:  06/20/21 116/85         Passed - Valid encounter within last 12 months    Recent Outpatient Visits          3 weeks ago No-show for appointment   [facility] Beryle Flock, Marzella Schlein, MD   5 months ago Attention deficit hyperactivity disorder (ADHD), combined type   [facility], Marzella Schlein, MD   11 months ago Sore throat   [facility] Osvaldo Angst M, [place]   11 months ago Cough   [facility] [place], Alessandra Bevels, [place]   1 year ago Finger swelling   [facility] Osvaldo Angst [place], [place]

## 2021-11-19 NOTE — Telephone Encounter (Signed)
Summary: birth control left at school   Patient mom Alcario Drought called in to say patient forgot her medication norethindrone-ethinyl estradiol-FE (JUNEL FE 1/20) 1-20 MG-MCG tablet at school and need an Rx [id number] [place], [place] - [address] Phone: [phone]  Fax: [phone]. Any questions please call Ph# [phone]     Called pt and LM to make sure call pharmacy- and that it may be an out of pocket cost. Advised to call back to discuss.

## 2021-12-10 ENCOUNTER — Other Ambulatory Visit: Payer: Self-pay | Admitting: Family Medicine

## 2021-12-10 MED ORDER — AMPHETAMINE-DEXTROAMPHET ER 30 MG PO CP24: 30.0000 mg | ORAL_CAPSULE | ORAL | 0 refills | Status: DC

## 2021-12-10 NOTE — Telephone Encounter (Signed)
Requested medication (s) are due for refill today: yes  Requested medication (s) are on the active medication list: yes  Last refill:  11/07/21 #30  Future visit scheduled: no  Notes to clinic:  med not delegated to NT to RF   Requested Prescriptions  Pending Prescriptions Disp Refills   amphetamine-dextroamphetamine (ADDERALL XR) 30 MG 24 hr capsule 30 capsule 0    Sig: Take 1 capsule (30 mg total) by mouth every morning.     Not Delegated - Psychiatry:  Stimulants/ADHD Failed - 12/10/2021  9:20 AM      Failed - This refill cannot be delegated      Failed - Urine Drug Screen completed in last 360 days      Passed - Valid encounter within last 3 months    Recent Outpatient Visits           1 month ago No-show for appointment   [facility] Brita Romp, Dionne Bucy, MD   5 months ago Attention deficit hyperactivity disorder (ADHD), combined type   [facility], Dionne Bucy, MD   12 months ago Sore throat   [facility] Trinna Post, [place]   1 year ago Cough   [place], Clearnce Sorrel, [place]   1 year ago Finger swelling   [place], [place], [place]

## 2021-12-10 NOTE — Telephone Encounter (Signed)
Medication Refill - Medication: amphetamine-dextroamphetamine (ADDERALL XR) 30 MG 24 hr capsule  Has the patient contacted their pharmacy? No. (Agent: If no, request that the patient contact the pharmacy for the refill. If patient does not wish to contact the pharmacy document the reason why and proceed with request.) (Agent: If yes, when and what did the pharmacy advise?)  Preferred Pharmacy (with phone number or street name): [id number] [place], [place] - [address] AT [address]  Has the patient been seen for an appointment in the last year OR does the patient have an upcoming appointment? Yes.    Agent: Please be advised that RX refills may take up to 3 business days. We ask that you follow-up with your pharmacy.

## 2022-01-09 ENCOUNTER — Telehealth (INDEPENDENT_AMBULATORY_CARE_PROVIDER_SITE_OTHER): Payer: 59 | Admitting: Family Medicine

## 2022-01-09 ENCOUNTER — Encounter: Payer: Self-pay | Admitting: Family Medicine

## 2022-01-09 ENCOUNTER — Other Ambulatory Visit: Payer: Self-pay

## 2022-01-09 DIAGNOSIS — J0140 Acute pansinusitis, unspecified: Secondary | ICD-10-CM

## 2022-01-09 MED ORDER — DOXYCYCLINE HYCLATE 100 MG PO TABS: 100.0000 mg | ORAL_TABLET | Freq: Two times a day (BID) | ORAL | 0 refills | Status: AC

## 2022-01-09 NOTE — Progress Notes (Signed)
MyChart Video Visit    Virtual Visit via Video Note   This visit type was conducted due to national recommendations for restrictions regarding the COVID-19 Pandemic (e.g. social distancing) in an effort to limit this patient's exposure and mitigate transmission in our community. This patient is at least at moderate risk for complications without adequate follow up. This format is felt to be most appropriate for this patient at this time. Physical exam was limited by quality of the video and audio technology used for the visit.    Patient location: home Provider location: [place] involved in the visit: patient, provider  I discussed the limitations of evaluation and management by telemedicine and the availability of in person appointments. The patient expressed understanding and agreed to proceed.  Patient: Krista Holmes   DOB: 29-Jun-2001   21 y.o. Female  MRN: [id number] Visit Date: 01/09/2022  Today's healthcare provider: Lavon Paganini, MD   Chief Complaint  Patient presents with   Sinusitis   Subjective    Sinus Problem This is a recurrent problem. The current episode started in the past 7 days. The problem has been waxing and waning since onset. There has been no fever. The pain is mild. Associated symptoms include congestion, coughing, ear pain, shortness of breath, sinus pressure and a sore throat. Pertinent negatives include no chills. Past treatments include oral decongestants. The treatment provided moderate relief.      Medications: Outpatient Medications Prior to Visit  Medication Sig   amphetamine-dextroamphetamine (ADDERALL XR) 30 MG 24 hr capsule Take 1 capsule (30 mg total) by mouth every morning. (Patient taking differently: Take 30 mg by mouth every other day.)   norethindrone-ethinyl estradiol-FE (JUNEL FE 1/20) 1-20 MG-MCG tablet Take 1 tablet by mouth daily.   amphetamine-dextroamphetamine (ADDERALL XR) 30 MG 24 hr capsule  Take 1 capsule (30 mg total) by mouth every morning.   amphetamine-dextroamphetamine (ADDERALL XR) 30 MG 24 hr capsule Take 1 capsule (30 mg total) by mouth daily.   amphetamine-dextroamphetamine (ADDERALL XR) 30 MG 24 hr capsule Take 1 capsule (30 mg total) by mouth daily.   amphetamine-dextroamphetamine (ADDERALL XR) 30 MG 24 hr capsule Take 1 capsule (30 mg total) by mouth every morning.   No facility-administered medications prior to visit.    Review of Systems  Constitutional:  Negative for appetite change, chills, fatigue and fever.  HENT:  Positive for congestion, ear pain, sinus pressure and sore throat.   Respiratory:  Positive for cough and shortness of breath. Negative for wheezing.      Objective    There were no vitals taken for this visit.   Physical Exam Constitutional:      General: She is not in acute distress.    Appearance: Normal appearance.  HENT:     Head: Normocephalic.  Pulmonary:     Effort: Pulmonary effort is normal. No respiratory distress.  Neurological:     Mental Status: She is alert and oriented to person, place, and time. Mental status is at baseline.       Assessment & Plan     1. Acute non-recurrent pansinusitis - symptoms and exam c/w sinusitis   - no evidence of AOM, CAP, strep pharyngitis, or other infection - given duration of symptoms, suspect bacterial etiology - will treat with Doxycycline x7d - discussed symptomatic management (flonase, decongestants, etc), natural course, and return precautions     No follow-ups on file.     I discussed the assessment and treatment  plan with the patient. The patient was provided an opportunity to ask questions and all were answered. The patient agreed with the plan and demonstrated an understanding of the instructions.   The patient was advised to call back or seek an in-person evaluation if the symptoms worsen or if the condition fails to improve as anticipated.  I, Lavon Paganini, MD,  have reviewed all documentation for this visit. The documentation on 01/09/22 for the exam, diagnosis, procedures, and orders are all accurate and complete.   Emile Ringgenberg, Dionne Bucy, MD, MPH [facility]

## 2022-01-13 ENCOUNTER — Other Ambulatory Visit: Payer: Self-pay | Admitting: Family Medicine

## 2022-01-13 NOTE — Telephone Encounter (Signed)
Medication Refill - Medication: amphetamine-dextroamphetamine (ADDERALL XR) 30 MG 24 hr capsule  Has the patient contacted their pharmacy? yes (Agent: If no, request that the patient contact the pharmacy for the refill. If patient does not wish to contact the pharmacy document the reason why and proceed with request.) (Agent: If yes, when and what did the pharmacy advise?)contact pcp Preferred Pharmacy (with phone number or street name): [id number] [place]. [address] AT [place] Phone:  [phone]  Fax:  [phone]     Has the patient been seen for an appointment in the last year OR does the patient have an upcoming appointment? yes  Agent: Please be advised that RX refills may take up to 3 business days. We ask that you follow-up with your pharmacy.

## 2022-01-14 NOTE — Telephone Encounter (Signed)
Requested medication (s) are due for refill today: yes  Requested medication (s) are on the active medication list: yes  Last refill:  12/10/21 #30/0  Future visit scheduled: yes  Notes to clinic:  Unable to refill per protocol, cannot delegate.    Requested Prescriptions  Pending Prescriptions Disp Refills   amphetamine-dextroamphetamine (ADDERALL XR) 30 MG 24 hr capsule 30 capsule 0    Sig: Take 1 capsule (30 mg total) by mouth every morning.     Not Delegated - Psychiatry:  Stimulants/ADHD Failed - 01/13/2022 10:09 AM      Failed - This refill cannot be delegated      Failed - Urine Drug Screen completed in last 360 days      Passed - Last BP in normal range    BP Readings from Last 1 Encounters:  06/20/21 116/85          Passed - Last Heart Rate in normal range    Pulse Readings from Last 1 Encounters:  06/20/21 89          Passed - Valid encounter within last 6 months    Recent Outpatient Visits           5 days ago Acute non-recurrent pansinusitis   [facility] [place], Marzella Schlein, MD   2 months ago No-show for appointment   [facility], Marzella Schlein, MD   6 months ago Attention deficit hyperactivity disorder (ADHD), combined type   [facility], Marzella Schlein, [place]   1 year ago Sore throat   [facility] Trey Sailors, [place]   1 year ago Cough   [facility] [place], Alessandra Bevels, [place]       Future Appointments             In 5 months Bacigalupo, Marzella Schlein, MD [facility], [facility]

## 2022-01-16 MED ORDER — AMPHETAMINE-DEXTROAMPHET ER 30 MG PO CP24: 30.0000 mg | ORAL_CAPSULE | ORAL | 0 refills | Status: DC

## 2022-02-17 ENCOUNTER — Other Ambulatory Visit: Payer: Self-pay | Admitting: Family Medicine

## 2022-02-17 NOTE — Telephone Encounter (Signed)
Copied from CRM [id number]. Topic: Quick Communication - Rx Refill/Question ?>> Feb 17, 2022 11:36 AM Jaquita Rector A wrote: ?Medication: amphetamine-dextroamphetamine (ADDERALL XR) 30 MG 24 hr capsule  ? ?Has the patient contacted their pharmacy? No. Since she is always told call Dr she no longer call pharmacy  ?(Agent: If no, request that the patient contact the pharmacy for the refill. If patient does not wish to contact the pharmacy document the reason why and proceed with request.) ?(Agent: If yes, when and what did the pharmacy advise?) ? ?Preferred Pharmacy (with phone number or street name): [id number] [place] [place] - [address] AT [address]  ?Phone:  [phone] ?Fax:  [phone] ? ? ? ?Has the patient been seen for an appointment in the last year OR does the patient have an upcoming appointment? Yes.   ? ?Agent: Please be advised that RX refills may take up to 3 business days. We ask that you follow-up with your pharmacy. ?

## 2022-02-17 NOTE — Telephone Encounter (Signed)
Requested medication (s) are due for refill today: yes ? ?Requested medication (s) are on the active medication list: yes ? ?Last refill:  01/16/22 #30 ? ?Future visit scheduled: yes ? ?Notes to clinic:  Please review for refill. Refill not delegated per protocol.  ? ? ? ?Requested Prescriptions  ?Pending Prescriptions Disp Refills  ? amphetamine-dextroamphetamine (ADDERALL XR) 30 MG 24 hr capsule 30 capsule 0  ?  Sig: Take 1 capsule (30 mg total) by mouth every morning.  ?  ? Not Delegated - Psychiatry:  Stimulants/ADHD Failed - 02/17/2022  9:44 PM  ?  ?  Failed - This refill cannot be delegated  ?  ?  Failed - Urine Drug Screen completed in last 360 days  ?  ?  Passed - Last BP in normal range  ?  BP Readings from Last 1 Encounters:  ?06/20/21 116/85  ?  ?  ?  ?  Passed - Last Heart Rate in normal range  ?  Pulse Readings from Last 1 Encounters:  ?06/20/21 89  ?  ?  ?  ?  Passed - Valid encounter within last 6 months  ?  Recent Outpatient Visits   ? ?      ? 1 month ago Acute non-recurrent pansinusitis  ? [facility] Bacigalupo, Marzella Schlein, MD  ? 3 months ago No-show for appointment  ? [facility] [place], Marzella Schlein, MD  ? 8 months ago Attention deficit hyperactivity disorder (ADHD), combined type  ? [facility] [place], Marzella Schlein, MD  ? 1 year ago Sore throat  ? [facility] [place], [place] M, [place]  ? 1 year ago Cough  ? [facility] [place], [place], [place]  ? ?  ?  ?Future Appointments   ? ?        ? In 4 months Bacigalupo, Marzella Schlein, MD [facility], [facility]  ? ?  ? ?  ?  ?  ? ?

## 2022-02-21 ENCOUNTER — Ambulatory Visit: Payer: Self-pay | Admitting: *Deleted

## 2022-02-21 ENCOUNTER — Other Ambulatory Visit: Payer: Self-pay | Admitting: Family Medicine

## 2022-02-21 DIAGNOSIS — F902 Attention-deficit hyperactivity disorder, combined type: Secondary | ICD-10-CM

## 2022-02-21 MED ORDER — AMPHETAMINE-DEXTROAMPHET ER 30 MG PO CP24: 30.0000 mg | ORAL_CAPSULE | ORAL | 0 refills | Status: DC

## 2022-02-21 NOTE — Telephone Encounter (Addendum)
This will be addressed in nurse triage encounter.Will attempt to reach mother to let her know the reason for the medication refusal ? ? ?Message from [facility] agent to nurse triage: ? ?Pt's mother is calling back to see what is the problem with filling this medication. I see it has been sent to Dr. Brita Romp, please assess. ?

## 2022-02-21 NOTE — Telephone Encounter (Signed)
Patient advised.KW 

## 2022-02-21 NOTE — Telephone Encounter (Signed)
Pt called in, she is needing refill on Adderall 30mg . It was refused on 02/17/22 d/t pt needs an appt. Last OV was 05/2021. Attempted to schedule appt for pt but she states d/t her being at school she can do VV. I advised her let me send to provider to make sure this is ok since I feel like provider is wanting OV but would give her a CB to schedule.  ?

## 2022-02-21 NOTE — Telephone Encounter (Signed)
Summary: Rx refill request  ? Pt mother called for an update on the Rx refill request submitted on 02/17/22. Pt mother requests that pt be contacted to advise. Cb# [phone]   ?  ?Voicemail left for mother. ?

## 2022-02-21 NOTE — Telephone Encounter (Signed)
Pt is not under our care. I believe this was written by Dr. Beryle Flock. ?

## 2022-02-21 NOTE — Telephone Encounter (Signed)
I called the call back number given  ?[phone], left voicemail to call back . This was the third attempt. ?

## 2022-02-21 NOTE — Telephone Encounter (Signed)
No answer at preferred number given in message, voicemail left. ?

## 2022-02-24 MED ORDER — AMPHETAMINE-DEXTROAMPHET ER 30 MG PO CP24: 30.0000 mg | ORAL_CAPSULE | ORAL | 0 refills | Status: DC

## 2022-02-24 NOTE — Telephone Encounter (Signed)
? ?  Notes to clinic:  duplicate request, signed 02/21/22, not delegated, please assess. ? ? ?  ? ?Requested Prescriptions  ?Pending Prescriptions Disp Refills  ? amphetamine-dextroamphetamine (ADDERALL XR) 30 MG 24 hr capsule 30 capsule 0  ?  Sig: Take 1 capsule (30 mg total) by mouth every morning.  ?  ? Not Delegated - Psychiatry:  Stimulants/ADHD Failed - 02/21/2022 11:44 AM  ?  ?  Failed - This refill cannot be delegated  ?  ?  Failed - Urine Drug Screen completed in last 360 days  ?  ?  Passed - Last BP in normal range  ?  BP Readings from Last 1 Encounters:  ?06/20/21 116/85  ?  ?  ?  ?  Passed - Last Heart Rate in normal range  ?  Pulse Readings from Last 1 Encounters:  ?06/20/21 89  ?  ?  ?  ?  Passed - Valid encounter within last 6 months  ?  Recent Outpatient Visits   ? ?      ? 1 month ago Acute non-recurrent pansinusitis  ? [facility] Bacigalupo, Dionne Bucy, MD  ? 3 months ago No-show for appointment  ? [facility] [place], Dionne Bucy, MD  ? 8 months ago Attention deficit hyperactivity disorder (ADHD), combined type  ? [facility] [place], Dionne Bucy, MD  ? 1 year ago Sore throat  ? [place], [place]  ? 1 year ago Cough  ? [place], [place]  ? ?  ?  ?Future Appointments   ? ?        ? Tomorrow Bacigalupo, Dionne Bucy, MD [facility], [facility]  ? In 3 months Bacigalupo, Dionne Bucy, MD [facility], [facility]  ? ?  ? ?  ?  ?  ? ? ?

## 2022-02-25 ENCOUNTER — Telehealth (INDEPENDENT_AMBULATORY_CARE_PROVIDER_SITE_OTHER): Payer: 59 | Admitting: Family Medicine

## 2022-02-25 ENCOUNTER — Encounter: Payer: Self-pay | Admitting: Family Medicine

## 2022-02-25 VITALS — Wt 155.0 lb

## 2022-02-25 DIAGNOSIS — F902 Attention-deficit hyperactivity disorder, combined type: Secondary | ICD-10-CM | POA: Diagnosis not present

## 2022-02-25 DIAGNOSIS — F321 Major depressive disorder, single episode, moderate: Secondary | ICD-10-CM | POA: Diagnosis not present

## 2022-02-25 DIAGNOSIS — F419 Anxiety disorder, unspecified: Secondary | ICD-10-CM | POA: Diagnosis not present

## 2022-02-25 MED ORDER — AMPHETAMINE-DEXTROAMPHET ER 30 MG PO CP24: 30.0000 mg | ORAL_CAPSULE | ORAL | 0 refills | Status: DC

## 2022-02-25 MED ORDER — SERTRALINE HCL 50 MG PO TABS: 50.0000 mg | ORAL_TABLET | Freq: Every day | ORAL | 3 refills | Status: DC

## 2022-02-25 NOTE — Progress Notes (Signed)
? ? ?I,Sulibeya S Dimas,acting as a scribe for Shirlee Latch, MD.,have documented all relevant documentation on the behalf of Shirlee Latch, MD,as directed by  Shirlee Latch, MD while in the presence of Shirlee Latch, MD. ? ?MyChart Video Visit ? ? ? ?Virtual Visit via Video Note  ? ?This visit type was conducted due to national recommendations for restrictions regarding the COVID-19 Pandemic (e.g. social distancing) in an effort to limit this patient's exposure and mitigate transmission in our community. This patient is at least at moderate risk for complications without adequate follow up. This format is felt to be most appropriate for this patient at this time. Physical exam was limited by quality of the video and audio technology used for the visit.  ? ? ?Patient location: home ?Provider location: [facility] ?Persons involved in the visit: patient, provider ? ?I discussed the limitations of evaluation and management by telemedicine and the availability of in person appointments. The patient expressed understanding and agreed to proceed. ? ?Patient: Krista Holmes   DOB: February 03, 2001   21 y.o. Female  MRN: [id number] ?Visit Date: 02/25/2022 ? ?Today's healthcare provider: Shirlee Latch, MD  ? ?Chief Complaint  ?Patient presents with  ? Depression  ? ?Subjective  ?  ?Depression ?       This is a recurrent (has had depression in the past.) problem.  The onset quality is gradual (past few months).   The problem occurs constantly.  The problem has been gradually worsening since onset.  Associated symptoms include decreased concentration, fatigue, helplessness, hopelessness, insomnia and decreased interest.     The symptoms are aggravated by social issues (school).  Treatments tried: has tried counseling in the past.  Previous treatment provided mild relief.  ? ? ?There are some anxiety symptoms as well. ? ?Getting worse over the last few months. ? ?Has done therapy in the past but  never took medications in the past. Scared of side effects and weight gain. ? ? ?  02/25/2022  ?  9:49 AM 06/20/2021  ?  4:18 PM 10/14/2019  ?  4:57 PM  ?Depression screen PHQ 2/9  ?Decreased Interest 1 1 0  ?Down, Depressed, Hopeless 2 1 0  ?PHQ - 2 Score 3 2 0  ?Altered sleeping 1 1   ?Tired, decreased energy 2 1   ?Change in appetite 1 0   ?Feeling bad or failure about yourself  2 0   ?Trouble concentrating 1 1   ?Moving slowly or fidgety/restless 0 0   ?Suicidal thoughts 0 0   ?PHQ-9 Score 10 5   ?Difficult doing work/chores Very difficult Not difficult at all   ?  ?----------------------------------------------------------------------------------------- ? ? ? ?Medications: ?Outpatient Medications Prior to Visit  ?Medication Sig  ? norethindrone-ethinyl estradiol-FE (JUNEL FE 1/20) 1-20 MG-MCG tablet Take 1 tablet by mouth daily.  ? [DISCONTINUED] amphetamine-dextroamphetamine (ADDERALL XR) 30 MG 24 hr capsule Take 1 capsule (30 mg total) by mouth every morning. (Patient taking differently: Take 30 mg by mouth every morning. Patient reports taking weekdays only)  ? [DISCONTINUED] amphetamine-dextroamphetamine (ADDERALL XR) 30 MG 24 hr capsule Take 1 capsule (30 mg total) by mouth every morning. Please keep your next appt on 4/4.  ? ?No facility-administered medications prior to visit.  ? ? ?Review of Systems  ?Constitutional:  Positive for fatigue.  ?Psychiatric/Behavioral:  Positive for decreased concentration and depression. The patient has insomnia.   ? ? ? ? Objective  ?  ?Wt 155 lb (70.3 kg)   BMI  26.61 kg/m?  ? ? ? ? ?Physical Exam ?Constitutional:   ?   General: She is not in acute distress. ?   Appearance: Normal appearance.  ?HENT:  ?   Head: Normocephalic.  ?Pulmonary:  ?   Effort: Pulmonary effort is normal. No respiratory distress.  ?Neurological:  ?   Mental Status: She is alert and oriented to person, place, and time. Mental status is at baseline.  ?  ? ? ? Assessment & Plan  ?  ? ?Problem List Items  Addressed This Visit   ? ?  ? Other  ? ADHD  ?  Chronic and well-controlled ?Continue Adderall XR at current dose ?  ?  ? Anxiety  ?  Chronic and uncontrolled ?Had previously been hesitant to start medications, but agreeable to this now ?Will Start Zoloft 50 mg daily ?Discussed potential side effects, incl GI upset, sexual dysfunction, and SI ?Discussed that it can take 6-8 weeks to reach full efficacy ?Contracted for safety - no SI/HI ?Discussed synergistic effects of medications and therapy  ?  ?  ? Relevant Medications  ? sertraline (ZOLOFT) 50 MG tablet  ? Current moderate episode of major depressive disorder without prior episode (HCC) - Primary  ?  New problem ?Uncontrolled ?Will Start Zoloft 50 mg daily ?Discussed potential side effects, incl GI upset, sexual dysfunction, and SI ?Discussed that it can take 6-8 weeks to reach full efficacy ?Contracted for safety - no SI/HI ?Discussed synergistic effects of medications and therapy  ?Follow-up in 6 weeks and repeat PHQ-9 and GAD-7 ?  ?  ? Relevant Medications  ? sertraline (ZOLOFT) 50 MG tablet  ?  ? ?Return in about 6 weeks (around 04/08/2022) for MDD/GAD f/u, virtual ok.  ?  ? ?I discussed the assessment and treatment plan with the patient. The patient was provided an opportunity to ask questions and all were answered. The patient agreed with the plan and demonstrated an understanding of the instructions. ?  ?The patient was advised to call back or seek an in-person evaluation if the symptoms worsen or if the condition fails to improve as anticipated. ? ?I, Shirlee Latch, MD, have reviewed all documentation for this visit. The documentation on 02/25/22 for the exam, diagnosis, procedures, and orders are all accurate and complete. ? ? ?Erasmo Downer, MD, MPH ?[facility] ?[facility]   ?

## 2022-02-25 NOTE — Assessment & Plan Note (Signed)
Chronic and uncontrolled ?Had previously been hesitant to start medications, but agreeable to this now ?Will Start Zoloft 50 mg daily ?Discussed potential side effects, incl GI upset, sexual dysfunction, and SI ?Discussed that it can take 6-8 weeks to reach full efficacy ?Contracted for safety - no SI/HI ?Discussed synergistic effects of medications and therapy  ?

## 2022-02-25 NOTE — Assessment & Plan Note (Signed)
New problem ?Uncontrolled ?Will Start Zoloft 50 mg daily ?Discussed potential side effects, incl GI upset, sexual dysfunction, and SI ?Discussed that it can take 6-8 weeks to reach full efficacy ?Contracted for safety - no SI/HI ?Discussed synergistic effects of medications and therapy  ?Follow-up in 6 weeks and repeat PHQ-9 and GAD-7 ?

## 2022-02-25 NOTE — Assessment & Plan Note (Signed)
Chronic and well-controlled ?Continue Adderall XR at current dose ?

## 2022-03-21 ENCOUNTER — Other Ambulatory Visit: Payer: Self-pay | Admitting: Family Medicine

## 2022-03-28 ENCOUNTER — Other Ambulatory Visit: Payer: Self-pay | Admitting: Family Medicine

## 2022-03-28 NOTE — Telephone Encounter (Signed)
Requested medication (s) are due for refill today: yes ? ?Requested medication (s) are on the active medication list: yes ? ?Last refill:  02/25/22 #30/0 ? ?Future visit scheduled: yes ? ?Notes to clinic:  Unable to refill per protocol, cannot delegate. ? ?  ?Requested Prescriptions  ?Pending Prescriptions Disp Refills  ? ADDERALL XR 30 MG 24 hr capsule [Pharmacy Med Name: ADDERALL XR 30 MG CAP] 30 capsule   ?  Sig: TAKE 1 CAPSULE BY MOUTH EVERY MORNING  ?  ? Not Delegated - Psychiatry:  Stimulants/ADHD Failed - 03/28/2022  5:16 PM  ?  ?  Failed - This refill cannot be delegated  ?  ?  Failed - Urine Drug Screen completed in last 360 days  ?  ?  Passed - Last BP in normal range  ?  BP Readings from Last 1 Encounters:  ?06/20/21 116/85  ?  ?  ?  ?  Passed - Last Heart Rate in normal range  ?  Pulse Readings from Last 1 Encounters:  ?06/20/21 89  ?  ?  ?  ?  Passed - Valid encounter within last 6 months  ?  Recent Outpatient Visits   ? ?      ? 1 month ago Current moderate episode of major depressive disorder without prior episode (HCC)  ? [facility] [place], Marzella Schlein, MD  ? 2 months ago Acute non-recurrent pansinusitis  ? [facility] Bacigalupo, Marzella Schlein, MD  ? 5 months ago No-show for appointment  ? [facility] [place], Marzella Schlein, MD  ? 9 months ago Attention deficit hyperactivity disorder (ADHD), combined type  ? [facility] [place], Marzella Schlein, MD  ? 1 year ago Sore throat  ? [facility] [place], [place], [place]  ? ?  ?  ?Future Appointments   ? ?        ? In 2 months Bacigalupo, Marzella Schlein, MD [facility]  ? ?  ? ? ?  ?  ?  ? ?

## 2022-04-03 ENCOUNTER — Other Ambulatory Visit: Payer: Self-pay | Admitting: Family Medicine

## 2022-04-03 NOTE — Telephone Encounter (Signed)
Requested medication (s) are due for refill today: no ? ?Requested medication (s) are on the active medication list: yes ? ?Last refill:  today #30/0 ? ?Future visit scheduled: yes ? ?Notes to clinic:  cannot delegate. ? ? ? ?  ?Requested Prescriptions  ?Pending Prescriptions Disp Refills  ? ADDERALL XR 30 MG 24 hr capsule 30 capsule 0  ?  Sig: Take 1 capsule (30 mg total) by mouth every morning.  ?  ? Not Delegated - Psychiatry:  Stimulants/ADHD Failed - 04/03/2022  1:47 PM  ?  ?  Failed - This refill cannot be delegated  ?  ?  Failed - Urine Drug Screen completed in last 360 days  ?  ?  Passed - Last BP in normal range  ?  BP Readings from Last 1 Encounters:  ?06/20/21 116/85  ?   ?  ?  Passed - Last Heart Rate in normal range  ?  Pulse Readings from Last 1 Encounters:  ?06/20/21 89  ?   ?  ?  Passed - Valid encounter within last 6 months  ?  Recent Outpatient Visits   ? ?      ? 1 month ago Current moderate episode of major depressive disorder without prior episode ([place])  ? [facility] [place], Dionne Bucy, MD  ? 2 months ago Acute non-recurrent pansinusitis  ? [facility] [place] Bacigalupo, Dionne Bucy, MD  ? 5 months ago No-show for appointment  ? [facility] [place], Dionne Bucy, MD  ? 9 months ago Attention deficit hyperactivity disorder (ADHD), combined type  ? [facility] [place], Dionne Bucy, MD  ? 1 year ago Sore throat  ? [facility] [place], [place], [place]  ? ?  ?  ?Future Appointments   ? ?        ? In 1 month [place], [place], [place]  ? In 2 months Bacigalupo, Dionne Bucy, MD [facility]  ? ?  ? ? ?  ?  ?  ? ?

## 2022-04-03 NOTE — Telephone Encounter (Signed)
Copied from CRM [id number]. Topic: General - Other ?>> Apr 03, 2022  9:42 AM Gaetana Michaelis A wrote: ?Reason for CRM: Medication Refill - Medication: amphetamine-dextroamphetamine (ADDERALL XR) 30 MG 24 hr capsule [[id number]] - the patient has 0 remaining  ? ?Has the patient contacted their pharmacy? Yes.  The patient has been directed to contact their PCP. ?(Agent: If no, request that the patient contact the pharmacy for the refill. If patient does not wish to contact the pharmacy document the reason why and proceed with request.) ?(Agent: If yes, when and what did the pharmacy advise?) ? ?Preferred Pharmacy (with phone number or street name): [facility] - [place], [place] - [address] ?[address] [place] [address] ?Phone: [phone] Fax: [phone] ?Hours: Not open 24 hours ? ? ?Has the patient been seen for an appointment in the last year OR does the patient have an upcoming appointment? Yes.   ? ?Agent: Please be advised that RX refills may take up to 3 business days. We ask that you follow-up with your pharmacy. ?

## 2022-04-03 NOTE — Telephone Encounter (Signed)
Already sent in this morning

## 2022-04-30 ENCOUNTER — Ambulatory Visit: Payer: Self-pay

## 2022-04-30 ENCOUNTER — Other Ambulatory Visit: Payer: Self-pay | Admitting: Family Medicine

## 2022-04-30 NOTE — Telephone Encounter (Signed)
  Chief Complaint: fatigue Symptoms: fatigue, exhausted feeling, SOB at times since COVID Frequency: ongoing for months but gotten worse in last 6 months Pertinent Negatives: NA Disposition: [] ED /[] Urgent Care (no appt availability in office) / [x] Appointment(In office/virtual)/ []  [facility]/ [] Home Care/ [] Refused Recommended Disposition /[] [place] [facility]/ []  Follow-up with PCP Additional Notes: pt is scheduled for physical on 05/05/22 with Dr. . I advised mom that we have appts open for tomorrow with Dr. B at 0900 and 1000 if pt wanted to come in and be seen and then Dr. B could decide if she needed to keep appt for Monday or not. Mom tried reaching out to pt but unable to get answer. She states to keep appt for Monday and she could reach out to pt and call back if pt wanted sooner appt.   Reason for Disposition  [1] MILD weakness (i.e., does not interfere with ability to work, go to school, normal activities) AND [2] persists > 1 week  Answer Assessment - Initial Assessment Questions 1. DESCRIPTION: "Describe how you are feeling."     Feeling tired and exhausted  2. SEVERITY: "How bad is it?"  "Can you stand and walk?"   - MILD - Feels weak or tired, but does not interfere with work, school or normal activities   - MODERATE - Able to stand and walk; weakness interferes with work, school, or normal activities   - SEVERE - Unable to stand or walk     Mild to moderate  3. ONSET:  "When did the weakness begin?"     6 months have been worse  6. OTHER SYMPTOMS: "Do you have any other symptoms?" (e.g., chest pain, fever, cough, SOB, vomiting, diarrhea, bleeding, other areas of pain)     SOB since COVID in 2021  Protocols used: Weakness (Generalized) and Fatigue-A-AH

## 2022-04-30 NOTE — Telephone Encounter (Signed)
Medication Refill - Medication:  ADDERALL XR  Has the patient contacted their pharmacy? Yes.   (Agent: If no, request that the patient contact the pharmacy for the refill. If patient does not wish to contact the pharmacy document the reason why and proceed with request.) (Agent: If yes, when and what did the pharmacy advise?)  Preferred Pharmacy (with phone number or street name):  [id number] [place] [address] Phone:  [phone]  Fax:  [phone]     Has the patient been seen for an appointment in the last year OR does the patient have an upcoming appointment? Yes.    Agent: Please be advised that RX refills may take up to 3 business days. We ask that you follow-up with your pharmacy.

## 2022-04-30 NOTE — Telephone Encounter (Signed)
Requested medication (s) are due for refill today - yes  Requested medication (s) are on the active medication list -yes  Future visit scheduled -yes  Last refill: 04/03/22 #30  Notes to clinic: non delegated Rx  Requested Prescriptions  Pending Prescriptions Disp Refills   ADDERALL XR 30 MG 24 hr capsule 30 capsule 0    Sig: Take 1 capsule (30 mg total) by mouth every morning.     Not Delegated - Psychiatry:  Stimulants/ADHD Failed - 04/30/2022  2:12 PM      Failed - This refill cannot be delegated      Failed - Urine Drug Screen completed in last 360 days      Passed - Last BP in normal range    BP Readings from Last 1 Encounters:  06/20/21 116/85         Passed - Last Heart Rate in normal range    Pulse Readings from Last 1 Encounters:  06/20/21 89         Passed - Valid encounter within last 6 months    Recent Outpatient Visits           2 months ago Current moderate episode of major depressive disorder without prior episode (HCC)   [facility] [place], Marzella Schlein, MD   3 months ago Acute non-recurrent pansinusitis   [facility] [place], Marzella Schlein, MD   6 months ago No-show for appointment   [facility], Marzella Schlein, MD   10 months ago Attention deficit hyperactivity disorder (ADHD), combined type   [facility], Marzella Schlein, MD   1 year ago Sore throat   [facility] Trey Sailors, [place]       Future Appointments             In 5 days Bacigalupo, Marzella Schlein, MD [facility]   In 1 week [place], [place], MD [facility]                Requested Prescriptions  Pending Prescriptions Disp Refills   ADDERALL XR 30 MG 24 hr capsule 30 capsule 0    Sig: Take 1 capsule (30 mg total) by mouth every morning.     Not Delegated - Psychiatry:  Stimulants/ADHD Failed - 04/30/2022  2:12 PM      Failed - This refill cannot be delegated       Failed - Urine Drug Screen completed in last 360 days      Passed - Last BP in normal range    BP Readings from Last 1 Encounters:  06/20/21 116/85         Passed - Last Heart Rate in normal range    Pulse Readings from Last 1 Encounters:  06/20/21 89         Passed - Valid encounter within last 6 months    Recent Outpatient Visits           2 months ago Current moderate episode of major depressive disorder without prior episode (HCC)   [facility] [place], Marzella Schlein, MD   3 months ago Acute non-recurrent pansinusitis   [facility] [place], Marzella Schlein, MD   6 months ago No-show for appointment   [facility], Marzella Schlein, MD   10 months ago Attention deficit hyperactivity disorder (ADHD), combined type   [facility], Marzella Schlein, MD   1 year ago Sore throat   [facility] Trey Sailors, PA-C       Future Appointments             In 5 days Bacigalupo, Marzella Schlein, MD [facility]   In 1 week [facility], [place], MD [facility]

## 2022-05-01 MED ORDER — ADDERALL XR 30 MG PO CP24: 30.0000 mg | ORAL_CAPSULE | Freq: Every morning | ORAL | 0 refills | Status: DC

## 2022-05-05 ENCOUNTER — Encounter: Payer: 59 | Admitting: Family Medicine

## 2022-05-05 NOTE — Progress Notes (Deleted)
I,Jerrin Recore S Jadavion Spoelstra,acting as a Neurosurgeon for Shirlee Latch, MD.,have documented all relevant documentation on the behalf of Shirlee Latch, MD,as directed by  Shirlee Latch, MD while in the presence of Shirlee Latch, MD.   Complete physical exam   Patient: Krista Holmes   DOB: [birth year]   20 y.o. Female  MRN: [id number] Visit Date: 05/06/2022  Today's healthcare provider: Shirlee Latch, MD   No chief complaint on file.  Subjective    Krista Holmes is a 21 y.o. female who presents today for a complete physical exam.  She reports consuming a {diet types:17450} diet. {Exercise:19826} She generally feels {well/fairly well/poorly:18703}. She reports sleeping {well/fairly well/poorly:18703}. She {does/does not:200015} have additional problems to discuss today.  HPI    Past Medical History:  Diagnosis Date   Allergy    Seasonal allergies/ sneezing, coughing etc   Dysmenorrhea    GERD (gastroesophageal reflux disease)    Past Surgical History:  Procedure Laterality Date   NO PAST SURGERIES     Social History   Socioeconomic History   Marital status: Single    Spouse name: Not on file   Number of children: Not on file   Years of education: Not on file   Highest education level: Not on file  Occupational History   Not on file  Tobacco Use   Smoking status: Never   Smokeless tobacco: Never  Vaping Use   Vaping Use: Never used  Substance and Sexual Activity   Alcohol use: Yes    Comment: occasional   Drug use: No   Sexual activity: Yes    Birth control/protection: Pill  Other Topics Concern   Not on file  Social History Narrative   Not on file   Social Determinants of Health   Financial Resource Strain: Not on file  Food Insecurity: Not on file  Transportation Needs: Not on file  Physical Activity: Not on file  Stress: Not on file  Social Connections: Not on file  Intimate Partner Violence: Not on file   Family Status  Relation Name  Status   Mother  Alive   Father  Alive   MGM  Alive   Family History  Problem Relation Age of Onset   Heart attack Maternal Grandmother    Allergies  Allergen Reactions   Other Hives    Red Hi-C   Omnicef [Cefdinir] Rash   Zithromax [Azithromycin] Rash    Patient Care Team: Erasmo Downer, MD as PCP - General (Family Medicine)   Medications: Outpatient Medications Prior to Visit  Medication Sig   sertraline (ZOLOFT) 50 MG tablet TAKE 1 TABLET BY MOUTH EVERY DAY   ADDERALL XR 30 MG 24 hr capsule Take 1 capsule (30 mg total) by mouth every morning.   norethindrone-ethinyl estradiol-FE (JUNEL FE 1/20) 1-20 MG-MCG tablet Take 1 tablet by mouth daily.   No facility-administered medications prior to visit.    Review of Systems  All other systems reviewed and are negative.       Objective    There were no vitals taken for this visit. BP Readings from Last 3 Encounters:  06/20/21 116/85  04/23/21 122/78  04/02/20 117/75   Wt Readings from Last 3 Encounters:  02/25/22 155 lb (70.3 kg)  06/20/21 161 lb (73 kg) (88 %, Z= 1.15)*  04/23/21 160 lb (72.6 kg) (87 %, Z= 1.13)*   * Growth percentiles are based on CDC (Girls, 2-20 Years) data.       Physical Exam  ***  Last depression screening scores    02/25/2022    9:49 AM 06/20/2021    4:18 PM 10/14/2019    4:57 PM  PHQ 2/9 Scores  PHQ - 2 Score 3 2 0  PHQ- 9 Score 10 5    Last fall risk screening    06/20/2021    4:17 PM  Fall Risk   Falls in the past year? 0  Number falls in past yr: 0  Injury with Fall? 0   Last Audit-C alcohol use screening    06/20/2021    4:17 PM  Alcohol Use Disorder Test (AUDIT)  1. How often do you have a drink containing alcohol? 1  2. How many drinks containing alcohol do you have on a typical day when you are drinking? 1  3. How often do you have six or more drinks on one occasion? 0  AUDIT-C Score 2   A score of 3 or more in women, and 4 or more in men indicates  increased risk for alcohol abuse, EXCEPT if all of the points are from question 1   No results found for any visits on 05/06/22.  Assessment & Plan    Routine Health Maintenance and Physical Exam  Exercise Activities and Dietary recommendations  Goals   None      There is no immunization history on file for this patient.  Health Maintenance  Topic Date Due   HPV VACCINES (1 - 2-dose series) Never done   CHLAMYDIA SCREENING  Never done   HIV Screening  Never done   Hepatitis C Screening  Never done   TETANUS/TDAP  Never done   INFLUENZA VACCINE  06/24/2022    Discussed health benefits of physical activity, and encouraged her to engage in regular exercise appropriate for her age and condition.  ***  No follow-ups on file.     {provider attestation***:1}   Shirlee Latch, MD  [facility] [phone] (phone) [phone] (fax)  [facility]

## 2022-05-06 ENCOUNTER — Encounter: Payer: 59 | Admitting: Obstetrics and Gynecology

## 2022-05-06 ENCOUNTER — Encounter: Payer: 59 | Admitting: Family Medicine

## 2022-05-13 ENCOUNTER — Ambulatory Visit: Payer: 59 | Admitting: Dermatology

## 2022-05-20 ENCOUNTER — Emergency Department: Payer: 59

## 2022-05-20 ENCOUNTER — Other Ambulatory Visit: Payer: Self-pay

## 2022-05-20 ENCOUNTER — Emergency Department
Admission: EM | Admit: 2022-05-20 | Discharge: 2022-05-20 | Disposition: A | Discharge status: Home / Self Care | Payer: 59 | Attending: Emergency Medicine | Admitting: Emergency Medicine

## 2022-05-20 ENCOUNTER — Encounter: Payer: Self-pay | Admitting: Emergency Medicine

## 2022-05-20 DIAGNOSIS — R0789 Other chest pain: Secondary | ICD-10-CM | POA: Insufficient documentation

## 2022-05-20 LAB — COMPREHENSIVE METABOLIC PANEL
ALT: 15 U/L (ref 0–44)
AST: 16 U/L (ref 15–41)
Albumin: 4 g/dL (ref 3.5–5.0)
Alkaline Phosphatase: 41 U/L (ref 38–126)
Anion gap: 8 (ref 5–15)
BUN: 12 mg/dL (ref 6–20)
CO2: 24 mmol/L (ref 22–32)
Calcium: 9.2 mg/dL (ref 8.9–10.3)
Chloride: 108 mmol/L (ref 98–111)
Creatinine, Ser: 0.72 mg/dL (ref 0.44–1.00)
GFR, Estimated: >60 mL/min (ref >60)
Glucose, Bld: 101 mg/dL — ABNORMAL HIGH (ref 70–99)
Potassium: 4.3 mmol/L (ref 3.5–5.1)
Sodium: 140 mmol/L (ref 135–145)
Total Bilirubin: 0.8 mg/dL (ref 0.3–1.2)
Total Protein: 6.9 g/dL (ref 6.5–8.1)

## 2022-05-20 LAB — CBC WITH DIFFERENTIAL/PLATELET
Abs Immature Granulocytes: 0.01 10*3/uL (ref 0.00–0.07)
Basophils Absolute: 0 10*3/uL (ref 0.0–0.1)
Basophils Relative: 0 %
Eosinophils Absolute: 0.1 10*3/uL (ref 0.0–0.5)
Eosinophils Relative: 1 %
HCT: 38.6 % (ref 36.0–46.0)
Hemoglobin: 12.9 g/dL (ref 12.0–15.0)
Immature Granulocytes: 0 %
Lymphocytes Relative: 35 %
Lymphs Abs: 2.5 10*3/uL (ref 0.7–4.0)
MCH: 29 pg (ref 26.0–34.0)
MCHC: 33.4 g/dL (ref 30.0–36.0)
MCV: 86.7 fL (ref 80.0–100.0)
Monocytes Absolute: 0.6 10*3/uL (ref 0.1–1.0)
Monocytes Relative: 9 %
Neutro Abs: 3.9 10*3/uL (ref 1.7–7.7)
Neutrophils Relative %: 55 %
Platelets: 252 10*3/uL (ref 150–400)
RBC: 4.45 MIL/uL (ref 3.87–5.11)
RDW: 11.9 % (ref 11.5–15.5)
WBC: 7 10*3/uL (ref 4.0–10.5)
nRBC: 0 % (ref 0.0–0.2)

## 2022-05-20 LAB — D-DIMER, QUANTITATIVE: D-Dimer, Quant: <0.27 ug/mL-FEU (ref 0.00–0.50)

## 2022-05-20 LAB — TROPONIN I (HIGH SENSITIVITY)
Troponin I (High Sensitivity): <2 ng/L (ref <18)
Troponin I (High Sensitivity): <2 ng/L (ref <18)

## 2022-05-23 ENCOUNTER — Encounter: Payer: 59 | Admitting: Family Medicine

## 2022-05-28 ENCOUNTER — Ambulatory Visit (INDEPENDENT_AMBULATORY_CARE_PROVIDER_SITE_OTHER): Payer: 59 | Admitting: Family Medicine

## 2022-05-28 ENCOUNTER — Encounter: Payer: 59 | Admitting: Family Medicine

## 2022-05-28 ENCOUNTER — Encounter: Payer: Self-pay | Admitting: Family Medicine

## 2022-05-28 VITALS — BP 113/80 | HR 108 | Temp 98.1°F | Resp 16 | Wt 154.9 lb

## 2022-05-28 DIAGNOSIS — F902 Attention-deficit hyperactivity disorder, combined type: Secondary | ICD-10-CM

## 2022-05-28 DIAGNOSIS — F419 Anxiety disorder, unspecified: Secondary | ICD-10-CM

## 2022-05-28 DIAGNOSIS — Z0000 Encounter for general adult medical examination without abnormal findings: Secondary | ICD-10-CM | POA: Diagnosis not present

## 2022-05-28 DIAGNOSIS — L659 Nonscarring hair loss, unspecified: Secondary | ICD-10-CM

## 2022-05-28 DIAGNOSIS — F321 Major depressive disorder, single episode, moderate: Secondary | ICD-10-CM

## 2022-05-28 MED ORDER — CITALOPRAM HYDROBROMIDE 10 MG PO TABS: 10.0000 mg | ORAL_TABLET | Freq: Every day | ORAL | 0 refills | Status: DC

## 2022-05-28 NOTE — Patient Instructions (Addendum)
It was great to see you!  Our plans for today:  - Try the citalopram. - Come back in 1-2 months.   We are checking some labs today, we will release these results to your MyChart.  Take care and seek immediate care sooner if you develop any concerns.   Dr. Linwood Dibbles   Mental Health Apps and Websites Here are a few free apps meant to help you to help yourself.  To find, try searching on the internet to see if the app is offered on Apple/Android devices. If your first choice doesn't come up on your device, the good news is that there are many choices! Play around with different apps to see which ones are helpful to you . Calm This is an app meant to help increase calm feelings. Includes info, strategies, and tools for tracking your feelings.   Calm Harm  This app is meant to help with self-harm. Provides many 5-minute or 15-min coping strategies for doing instead of hurting yourself.    Healthy Minds Health Minds is a problem-solving tool to help deal with emotions and cope with stress you encounter wherever you are.    MindShift This app can help people cope with anxiety. Rather than trying to avoid anxiety, you can make an important shift and face it.    MY3  MY3 features a support system, safety plan and resources with the goal of offering a tool to use in a time of need.    My Life My Voice  This mood journal offers a simple solution for tracking your thoughts, feelings and moods. Animated emoticons can help identify your mood.   Relax Melodies Designed to help with sleep, on this app you can mix sounds and meditations for relaxation.    Smiling Mind Smiling Mind is meditation made easy: it's a simple tool that helps put a smile on your mind.    Stop, Breathe & Think  A friendly, simple guide for people through meditations for mindfulness and compassion.  Stop, Breathe and Think Kids Enter your current feelings and choose a "mission" to help you cope. Offers videos for certain  moods instead of just sound recordings.     The [employer] The [employer] (V[employer]) contains simple tools to help patients with coping, relaxation, distraction, and positive thinking.

## 2022-05-28 NOTE — Assessment & Plan Note (Signed)
Chronic, not well controlled. Did not tolerate sertraline, will trial citalopram. Discussed resources for counseling. Handout provided on mental health apps. F/u in 1-2 months. 

## 2022-05-28 NOTE — Assessment & Plan Note (Signed)
Doing well on current regimen, no changes made today. 

## 2022-05-28 NOTE — Progress Notes (Signed)
I,Jana Robinson,acting as a Education administrator for [name], DO.,have documented all relevant documentation on the behalf of [name], DO,as directed by  [name], DO while in the presence of [name], DO.   Complete physical exam  Patient: Krista Holmes   DOB: [birth year]   21 y.o. Female  MRN: [id number]  Subjective:    Chief Complaint  Patient presents with   Annual Exam    Yzabella Crunk is a 21 y.o. female who presents today for a complete physical exam. She reports consuming a general diet. Gym/ health club routine includes cardio and mod to heavy weightlifting. She generally feels well. She reports sleeping fairly well. She does have additional problems to discuss today.  Reports tiredness and hair lose.    Anxiety - Medications: sertraline (started at last visit) - Taking: not taking. - Counseling: not currently, busy with work right now. During school she has counseling.  - Symptoms: tired     05/28/2022    4:00 PM 02/25/2022    9:51 AM  GAD 7 : Generalized Anxiety Score  Nervous, Anxious, on Edge 2 1  Control/stop worrying 2 1  Worry too much - different things 2 1  Trouble relaxing 2 1  Restless 0 1  Easily annoyed or irritable 0 0  Afraid - awful might happen 0 0  Total GAD 7 Score 8 5  Anxiety Difficulty Not difficult at all Somewhat difficult      05/28/2022    3:57 PM 02/25/2022    9:49 AM 06/20/2021    4:18 PM  Depression screen PHQ 2/9  Decreased Interest _0 Down, Depressed, Hopeless _1 PHQ - 2 Score _2 Altered sleeping _3 Tired, decreased energy _4 Change in appetite 1 1 0  Feeling bad or failure about yourself  1 2 0  Trouble concentrating 0 1 1  Moving slowly or fidgety/restless 0 0 0  Suicidal thoughts 0 0 0  PHQ-9 Score _5 Difficult doing work/chores Very difficult Very difficult Not difficult at all     Most recent fall risk assessment:    05/28/2022    3:57 PM  [place] in the  past year? 0  Number falls in past yr: 0  Injury with Fall? 0  Follow up Falls evaluation completed     Most recent depression screenings:    05/28/2022    3:57 PM 02/25/2022    9:49 AM  PHQ 2/9 Scores  PHQ - 2 Score 3 3  PHQ- 9 Score 10 10     Patient Care Team: Virginia Crews, MD as PCP - General (Family Medicine)   Outpatient Medications Prior to Visit  Medication Sig Note   ADDERALL XR 30 MG 24 hr capsule Take 1 capsule (30 mg total) by mouth every morning.    norethindrone-ethinyl estradiol-FE (JUNEL FE 1/20) 1-20 MG-MCG tablet Take 1 tablet by mouth daily.    [DISCONTINUED] sertraline (ZOLOFT) 50 MG tablet TAKE 1 TABLET BY MOUTH EVERY DAY (Patient not taking: Reported on 05/28/2022) 05/28/2022: Reports she woke with chest pain   No facility-administered medications prior to visit.    Review of Systems  Constitutional:  Positive for malaise/fatigue.  Respiratory:  Positive for shortness of breath.   Psychiatric/Behavioral:  The patient is nervous/anxious.   All other systems reviewed and are negative.      Objective:  BP 113/80 (BP Location: Right Arm, Patient Position: Sitting, Cuff Size: Normal)   Pulse (!) 108   Temp 98.1 F (36.7 C) (Oral)   Resp 16   Wt 154 lb 14.4 oz (70.3 kg)   SpO2 99%   BMI 26.59 kg/m    Physical Exam Constitutional:      Appearance: Normal appearance.  HENT:     Head: Normocephalic and atraumatic.     Right Ear: External ear normal.     Left Ear: External ear normal.     Nose: Nose normal.     Mouth/Throat:     Mouth: Mucous membranes are moist.     Pharynx: Oropharynx is clear.  Eyes:     Extraocular Movements: Extraocular movements intact.     Pupils: Pupils are equal, round, and reactive to light.  Cardiovascular:     Rate and Rhythm: Normal rate and regular rhythm.     Heart sounds: Normal heart sounds. No murmur heard. Pulmonary:     Effort: Pulmonary effort is normal.     Breath sounds: Normal breath sounds.   Abdominal:     General: Bowel sounds are normal.     Palpations: Abdomen is soft.     Tenderness: There is no abdominal tenderness.  Musculoskeletal:        General: Normal range of motion.     Right lower leg: No edema.     Left lower leg: No edema.  Lymphadenopathy:     Cervical: No cervical adenopathy.  Skin:    General: Skin is warm and dry.  Neurological:     Mental Status: She is alert and oriented to person, place, and time. Mental status is at baseline.     Gait: Gait normal.  Psychiatric:        Behavior: Behavior normal.      No results found for any visits on 05/28/22.    Assessment & Plan:    Routine Health Maintenance and Physical Exam  Immunization History  Administered Date(s) Administered   DTaP 08/23/2001, 10/26/2001, 12/28/2001, 09/22/2002   HPV 9-valent 06/23/2012, 11/03/2013, 06/20/2015   Hepatitis B 05/22/01, 07/23/2001, 03/28/2002   HiB (PRP-OMP) 08/23/2001, 10/26/2001, 12/28/2001, 09/22/2002   IPV 08/23/2001, 10/26/2001, 12/28/2001   MMR 06/28/2002   Meningococcal Mcv4o 06/20/2015   Tdap 06/23/2012   Varicella 06/28/2002    Health Maintenance  Topic Date Due   HIV Screening  Never done   Hepatitis C Screening  Never done   CHLAMYDIA SCREENING  05/29/2023 (Originally 06/22/2016)   TETANUS/TDAP  06/23/2022   INFLUENZA VACCINE  06/24/2022   HPV VACCINES  Completed    Discussed health benefits of physical activity, and encouraged her to engage in regular exercise appropriate for her age and condition.  Problem List Items Addressed This Visit       Other   ADHD    Doing well on current regimen, no changes made today.      Anxiety    Chronic, not well controlled. Did not tolerate sertraline, will trial citalopram. Discussed resources for counseling. Handout provided on mental health apps. F/u in 1-2 months.      Relevant Medications   citalopram (CELEXA) 10 MG tablet   Current moderate episode of major depressive disorder without  prior episode (HCC)    Chronic, not well controlled. Did not tolerate sertraline, will trial citalopram. Discussed resources for counseling. Handout provided on mental health apps. F/u in 1-2 months.      Relevant Medications   citalopram (  CELEXA) 10 MG tablet   Other Visit Diagnoses     Hair loss    -  Primary   Relevant Orders   TSH   Annual physical exam       Relevant Orders   TSH   Lipid panel   Hemoglobin A1c   HIV antibody (with reflex)   Hepatitis C antibody      Return in about 2 months (around 07/29/2022) for anxiety.     [name], DO

## 2022-05-28 NOTE — Progress Notes (Deleted)
   I,Jana Cyanne Delmar,acting as a Education administrator for Krista Gip, DO.,have documented all relevant documentation on the behalf of Krista Gip, DO,as directed by  Krista Gip, DO while in the presence of Krista Gip, DO.  Complete physical exam  Patient: Krista Holmes   DOB: [birth year]   21 y.o. Female  MRN: [id number]  Subjective:    No chief complaint on file.   Krista Holmes is a 21 y.o. female who presents today for a complete physical exam. She reports consuming a {diet types:17450} diet. {types:19826} She generally feels {DESC; WELL/FAIRLY WELL/POORLY:18703}. She reports sleeping {DESC; WELL/FAIRLY WELL/POORLY:18703}. She {does/does not:200015} have additional problems to discuss today.    Most recent fall risk assessment:    06/20/2021    4:17 PM  [place] in the past year? 0  Number falls in past yr: 0  Injury with Fall? 0     Most recent depression screenings:    02/25/2022    9:49 AM 06/20/2021    4:18 PM  PHQ 2/9 Scores  PHQ - 2 Score 3 2  PHQ- 9 Score 10 5    {VISON DENTAL STD PSA (Optional):27386}  {History (Optional):23778}  Patient Care Team: Virginia Crews, MD as PCP - General (Family Medicine)   Outpatient Medications Prior to Visit  Medication Sig   sertraline (ZOLOFT) 50 MG tablet TAKE 1 TABLET BY MOUTH EVERY DAY   ADDERALL XR 30 MG 24 hr capsule Take 1 capsule (30 mg total) by mouth every morning.   norethindrone-ethinyl estradiol-FE (JUNEL FE 1/20) 1-20 MG-MCG tablet Take 1 tablet by mouth daily.   No facility-administered medications prior to visit.    ROS        Objective:     There were no vitals taken for this visit. {Vitals History (Optional):23777}  Physical Exam   No results found for any visits on 05/28/22. {Show previous labs (optional):23779}    Assessment & Plan:    Routine Health Maintenance and Physical Exam  Immunization History  Administered Date(s) Administered   DTaP 08/23/2001,  10/26/2001, 12/28/2001, 09/22/2002   HPV 9-valent 06/23/2012, 11/03/2013, 06/20/2015   Hepatitis B 2001/04/22, 07/23/2001, 03/28/2002   HiB (PRP-OMP) 08/23/2001, 10/26/2001, 12/28/2001, 09/22/2002   IPV 08/23/2001, 10/26/2001, 12/28/2001   MMR 06/28/2002   Meningococcal Mcv4o 06/20/2015   Tdap 06/23/2012   Varicella 06/28/2002    Health Maintenance  Topic Date Due   CHLAMYDIA SCREENING  Never done   HIV Screening  Never done   Hepatitis C Screening  Never done   TETANUS/TDAP  06/23/2022   INFLUENZA VACCINE  06/24/2022   HPV VACCINES  Completed    Discussed health benefits of physical activity, and encouraged her to engage in regular exercise appropriate for her age and condition.  Problem List Items Addressed This Visit   None  No follow-ups on file.     Lenetta Quaker, CMA

## 2022-05-28 NOTE — Progress Notes (Deleted)
There were no vitals taken for this visit.   Subjective:    Patient ID: Krista Holmes, female    DOB: 2000/11/26, 21 y.o.   MRN: [id number]  HPI: Krista Holmes is a 21 y.o. female presenting on 05/28/2022 for comprehensive medical examination. Current medical complaints include:{Blank single:19197::"none","***"}  Anxiety, MDD - Medications: sertraline (started at last visit) - Taking: *** - Counseling: *** - Previous hospitalizations: *** - FH of psych illness: *** - Symptoms: *** - Current stressors: *** - Coping Mechanisms: ***   She currently lives with: Menopausal Symptoms: {Blank single:19197::"yes","no"}  Depression Screen done today and results listed below:     02/25/2022    9:49 AM 06/20/2021    4:18 PM 10/14/2019    4:57 PM  Depression screen PHQ 2/9  Decreased Interest 1 1 0  Down, Depressed, Hopeless 2 1 0  PHQ - 2 Score 3 2 0  Altered sleeping 1 1   Tired, decreased energy 2 1   Change in appetite 1 0   Feeling bad or failure about yourself  2 0   Trouble concentrating 1 1   Moving slowly or fidgety/restless 0 0   Suicidal thoughts 0 0   PHQ-9 Score 10 5   Difficult doing work/chores Very difficult Not difficult at all     The patient {has/does not MPNT:61443} a history of falls. I {did/did not:19850} complete a risk assessment for falls. A plan of care for falls {was/was not:19852} documented.   Past Medical History:  Past Medical History:  Diagnosis Date   Allergy    Seasonal allergies/ sneezing, coughing etc   Dysmenorrhea    GERD (gastroesophageal reflux disease)     Surgical History:  Past Surgical History:  Procedure Laterality Date   NO PAST SURGERIES      Medications:  Current Outpatient Medications on File Prior to Visit  Medication Sig   sertraline (ZOLOFT) 50 MG tablet TAKE 1 TABLET BY MOUTH EVERY DAY   ADDERALL XR 30 MG 24 hr capsule Take 1 capsule (30 mg total) by mouth every morning.   norethindrone-ethinyl estradiol-FE  (JUNEL FE 1/20) 1-20 MG-MCG tablet Take 1 tablet by mouth daily.   No current facility-administered medications on file prior to visit.    Allergies:  Allergies  Allergen Reactions   Other Hives    Red Hi-C   Omnicef [Cefdinir] Rash   Zithromax [Azithromycin] Rash    Social History:  Social History   Socioeconomic History   Marital status: Single    Spouse name: Not on file   Number of children: Not on file   Years of education: Not on file   Highest education level: Not on file  Occupational History   Not on file  Tobacco Use   Smoking status: Never   Smokeless tobacco: Never  Vaping Use   Vaping Use: Never used  Substance and Sexual Activity   Alcohol use: Yes    Comment: occasional   Drug use: No   Sexual activity: Yes    Birth control/protection: Pill  Other Topics Concern   Not on file  Social History Narrative   Not on file   Social Determinants of Health   Financial Resource Strain: Not on file  Food Insecurity: Not on file  Transportation Needs: Not on file  Physical Activity: Not on file  Stress: Not on file  Social Connections: Not on file  Intimate Partner Violence: Not on file   Social History   Tobacco Use  Smoking Status Never  Smokeless Tobacco Never   Social History   Substance and Sexual Activity  Alcohol Use Yes   Comment: occasional    Family History:  Family History  Problem Relation Age of Onset   Heart attack Maternal Grandmother     Past medical history, surgical history, medications, allergies, family history and social history reviewed with patient today and changes made to appropriate areas of the chart.      Objective:    There were no vitals taken for this visit.  Wt Readings from Last 3 Encounters:  05/20/22 154 lb 15.7 oz (70.3 kg)  02/25/22 155 lb (70.3 kg)  06/20/21 161 lb (73 kg) (88 %, Z= 1.15)*   * Growth percentiles are based on CDC (Girls, 2-20 Years) data.    Physical Exam  Results for orders  placed or performed during the hospital encounter of 05/20/22  Comprehensive metabolic panel  Result Value Ref Range   Sodium 140 135 - 145 mmol/L   Potassium 4.3 3.5 - 5.1 mmol/L   Chloride 108 98 - 111 mmol/L   CO2 24 22 - 32 mmol/L   Glucose, Bld 101 (H) 70 - 99 mg/dL   BUN 12 6 - 20 mg/dL   Creatinine, Ser 5.64 0.44 - 1.00 mg/dL   Calcium 9.2 8.9 - 33.2 mg/dL   Total Protein 6.9 6.5 - 8.1 g/dL   Albumin 4.0 3.5 - 5.0 g/dL   AST 16 15 - 41 U/L   ALT 15 0 - 44 U/L   Alkaline Phosphatase 41 38 - 126 U/L   Total Bilirubin 0.8 0.3 - 1.2 mg/dL   GFR, Estimated >95 >18 mL/min   Anion gap 8 5 - 15  CBC with Differential/Platelet  Result Value Ref Range   WBC 7.0 4.0 - 10.5 K/uL   RBC 4.45 3.87 - 5.11 MIL/uL   Hemoglobin 12.9 12.0 - 15.0 g/dL   HCT 84.1 66.0 - 63.0 %   MCV 86.7 80.0 - 100.0 fL   MCH 29.0 26.0 - 34.0 pg   MCHC 33.4 30.0 - 36.0 g/dL   RDW 16.0 10.9 - 32.3 %   Platelets 252 150 - 400 K/uL   nRBC 0.0 0.0 - 0.2 %   Neutrophils Relative % 55 %   Neutro Abs 3.9 1.7 - 7.7 K/uL   Lymphocytes Relative 35 %   Lymphs Abs 2.5 0.7 - 4.0 K/uL   Monocytes Relative 9 %   Monocytes Absolute 0.6 0.1 - 1.0 K/uL   Eosinophils Relative 1 %   Eosinophils Absolute 0.1 0.0 - 0.5 K/uL   Basophils Relative 0 %   Basophils Absolute 0.0 0.0 - 0.1 K/uL   Immature Granulocytes 0 %   Abs Immature Granulocytes 0.01 0.00 - 0.07 K/uL  D-dimer, quantitative  Result Value Ref Range   D-Dimer, Quant <0.27 0.00 - 0.50 ug/mL-FEU  Troponin I (High Sensitivity)  Result Value Ref Range   Troponin I (High Sensitivity) <2 <18 ng/L  Troponin I (High Sensitivity)  Result Value Ref Range   Troponin I (High Sensitivity) <2 <18 ng/L      Assessment & Plan:   Problem List Items Addressed This Visit   None    Follow up plan: No follow-ups on file.   LABORATORY TESTING:  - Pap smear: not applicable  IMMUNIZATIONS:   - Tdap: Tetanus vaccination status reviewed: last tetanus booster within  10 years. - Influenza: Postponed to flu season - Pneumovax: Not applicable - Prevnar:  Not applicable - HPV: Up to date - Shingrix vaccine: Not applicable - COVID vaccine: ***  SCREENING: - Mammogram: {Blank single:19197::"Up to date","Ordered today","Not applicable","Refused","Done elsewhere"}  - Colonoscopy: {Blank single:19197::"Up to date","Ordered today","Not applicable","Refused","Done elsewhere"}  - Bone Density: Not applicable  - Lung Cancer Screening: Not applicable   Hep C Screening: due STD testing and prevention (HIV/chl/gon/syphilis): *** Sexual History : Menstrual History/LMP/Abnormal Bleeding:  Incontinence Symptoms:   Osteoporosis: Discussed high calcium and vitamin D supplementation, weight bearing exercises  Advanced Care Planning: A voluntary discussion about advance care planning including the explanation and discussion of advance directives.  Discussed health care proxy and Living will, and the patient was able to identify a health care proxy as ***.  Patient {DOES_DOES ZOX:09604} have a living will at present time. If patient does have living will, I have requested they bring this to the clinic to be scanned in to their chart.  PATIENT COUNSELING:   Advised to take 1 mg of folate supplement per day if capable of pregnancy.   Sexuality: Discussed sexually transmitted diseases, partner selection, use of condoms, avoidance of unintended pregnancy  and contraceptive alternatives.   Advised to avoid cigarette smoking.  I discussed with the patient that most people either abstain from alcohol or drink within safe limits (<=14/week and <=4 drinks/occasion for males, <=7/weeks and <= 3 drinks/occasion for females) and that the risk for alcohol disorders and other health effects rises proportionally with the number of drinks per week and how often a drinker exceeds daily limits.  Discussed cessation/primary prevention of drug use and availability of treatment for abuse.    Diet: Encouraged to adjust caloric intake to maintain  or achieve ideal body weight, to reduce intake of dietary saturated fat and total fat, to limit sodium intake by avoiding high sodium foods and not adding table salt, and to maintain adequate dietary potassium and calcium preferably from fresh fruits, vegetables, and low-fat dairy products.    stressed the importance of regular exercise  Injury prevention: Discussed safety belts, safety helmets, smoke detector, smoking near bedding or upholstery.   Dental health: Discussed importance of regular tooth brushing, flossing, and dental visits.    NEXT PREVENTATIVE PHYSICAL DUE IN 1 YEAR. No follow-ups on file.

## 2022-05-28 NOTE — Assessment & Plan Note (Signed)
Chronic, not well controlled. Did not tolerate sertraline, will trial citalopram. Discussed resources for counseling. Handout provided on mental health apps. F/u in 1-2 months.

## 2022-05-29 LAB — HIV ANTIBODY (ROUTINE TESTING W REFLEX): HIV Screen 4th Generation wRfx: NONREACTIVE

## 2022-05-29 LAB — TSH: TSH: 0.929 u[IU]/mL (ref 0.450–4.500)

## 2022-06-02 ENCOUNTER — Telehealth: Payer: Self-pay

## 2022-06-02 ENCOUNTER — Other Ambulatory Visit: Payer: Self-pay | Admitting: Family Medicine

## 2022-06-02 MED ORDER — ADDERALL XR 30 MG PO CP24: 30.0000 mg | ORAL_CAPSULE | Freq: Every morning | ORAL | 0 refills | Status: DC

## 2022-06-09 NOTE — Addendum Note (Signed)
Addended by: Carlean Purl on: 06/09/2022 06:13 PM   Modules accepted: Orders

## 2022-06-09 NOTE — Telephone Encounter (Signed)
Transmission failed for ADDERALL XR 30 MG 24 hr capsule / pharmacy never received it / please resend asap

## 2022-06-10 NOTE — Telephone Encounter (Signed)
Requested medication (s) are due for refill today: yes  Requested medication (s) are on the active medication list:yes  Last refill: 06/02/22  #30  0 refills  Future visit scheduled no  Notes to clinic: Transmission to pharmacy failed 06/03/22  at 8:03.  Please review. Thank you.  Requested Prescriptions  Pending Prescriptions Disp Refills   ADDERALL XR 30 MG 24 hr capsule 30 capsule 0    Sig: Take 1 capsule (30 mg total) by mouth every morning.     Not Delegated - Psychiatry:  Stimulants/ADHD Failed - 06/09/2022  6:13 PM      Failed - This refill cannot be delegated      Failed - Urine Drug Screen completed in last 360 days      Passed - Last BP in normal range    BP Readings from Last 1 Encounters:  05/28/22 113/80         Passed - Last Heart Rate in normal range    Pulse Readings from Last 1 Encounters:  05/28/22 (!) 108         Passed - Valid encounter within last 6 months    Recent Outpatient Visits           1 week ago Hair loss   [facility] Caro Laroche, DO   3 months ago Current moderate episode of major depressive disorder without prior episode [facility])   [facility] [place], Marzella Schlein, MD   5 months ago Acute non-recurrent pansinusitis   [facility] [place], Marzella Schlein, MD   7 months ago No-show for appointment   [facility], Marzella Schlein, MD   11 months ago Attention deficit hyperactivity disorder (ADHD), combined type   [facility], Marzella Schlein, MD              Signed Prescriptions Disp Refills   ADDERALL XR 30 MG 24 hr capsule 30 capsule 0    Sig: Take 1 capsule (30 mg total) by mouth every morning.     Not Delegated - Psychiatry:  Stimulants/ADHD Failed - 06/02/2022  4:33 PM      Failed - This refill cannot be delegated      Failed - Urine Drug Screen completed in last 360 days      Passed - Last BP in normal range    BP Readings from Last 1  Encounters:  05/28/22 113/80         Passed - Last Heart Rate in normal range    Pulse Readings from Last 1 Encounters:  05/28/22 (!) 108         Passed - Valid encounter within last 6 months    Recent Outpatient Visits           1 week ago Hair loss   [facility] Caro Laroche, DO   3 months ago Current moderate episode of major depressive disorder without prior episode [facility])   [facility] [place], Marzella Schlein, MD   5 months ago Acute non-recurrent pansinusitis   [facility] [place], Marzella Schlein, MD   7 months ago No-show for appointment   [facility], Marzella Schlein, MD   11 months ago Attention deficit hyperactivity disorder (ADHD), combined type   [facility], Marzella Schlein, MD

## 2022-06-10 NOTE — Telephone Encounter (Signed)
Transmission to pharmacy failed 06/03/22. Double checked with pharmacy and Rx was not received.

## 2022-06-13 ENCOUNTER — Other Ambulatory Visit: Payer: Self-pay | Admitting: Physician Assistant

## 2022-06-13 DIAGNOSIS — F902 Attention-deficit hyperactivity disorder, combined type: Secondary | ICD-10-CM

## 2022-06-13 MED ORDER — ADDERALL XR 30 MG PO CP24: 30.0000 mg | ORAL_CAPSULE | Freq: Every morning | ORAL | 0 refills | Status: DC

## 2022-06-13 NOTE — Progress Notes (Signed)
Rx sent 

## 2022-06-13 NOTE — Telephone Encounter (Signed)
The patients grandmother, Velna Hatchet called in because the patient and her mother are working. She states the patient has called about her prescription of Adderall 30 mg before. She states [facility] [address] can see it in the system but it has not been submitted. Patient has been without medication for at least several days. Please assist patient further.

## 2022-06-23 ENCOUNTER — Encounter: Payer: 59 | Admitting: Family Medicine

## 2022-07-10 ENCOUNTER — Other Ambulatory Visit: Payer: Self-pay | Admitting: Family Medicine

## 2022-07-10 DIAGNOSIS — F902 Attention-deficit hyperactivity disorder, combined type: Secondary | ICD-10-CM

## 2022-07-10 NOTE — Telephone Encounter (Signed)
Medication Refill - Medication: ADDERALL XR 30 MG 24 hr capsule   Has the patient contacted their pharmacy? No. (Agent: If no, request that the patient contact the pharmacy for the refill. If patient does not wish to contact the pharmacy document the reason why and proceed with request.) (Agent: If yes, when and what did the pharmacy advise?)  Preferred Pharmacy (with phone number or street name): [id number] [place], [place] - [address] AT [address] Has the patient been seen for an appointment in the last year OR does the patient have an upcoming appointment? Yes.    Agent: Please be advised that RX refills may take up to 3 business days. We ask that you follow-up with your pharmacy.

## 2022-07-10 NOTE — Telephone Encounter (Signed)
Requested medication (s) are due for refill today: yes  Requested medication (s) are on the active medication list: yes  Last refill:  06/13/22 #30/0  Future visit scheduled: no  Notes to clinic:  Unable to refill per protocol, cannot delegate.    Requested Prescriptions  Pending Prescriptions Disp Refills   ADDERALL XR 30 MG 24 hr capsule 30 capsule 0    Sig: Take 1 capsule (30 mg total) by mouth every morning.     Not Delegated - Psychiatry:  Stimulants/ADHD Failed - 07/10/2022  9:38 AM      Failed - This refill cannot be delegated      Failed - Urine Drug Screen completed in last 360 days      Passed - Last BP in normal range    BP Readings from Last 1 Encounters:  05/28/22 113/80         Passed - Last Heart Rate in normal range    Pulse Readings from Last 1 Encounters:  05/28/22 (!) 108         Passed - Valid encounter within last 6 months    Recent Outpatient Visits           1 month ago Hair loss   [facility] Caro Laroche, DO   4 months ago Current moderate episode of major depressive disorder without prior episode [facility])   [facility] [place], Marzella Schlein, MD   6 months ago Acute non-recurrent pansinusitis   [facility] [place], Marzella Schlein, MD   8 months ago No-show for appointment   [facility], Marzella Schlein, MD   1 year ago Attention deficit hyperactivity disorder (ADHD), combined type   [facility], Marzella Schlein, MD

## 2022-07-11 MED ORDER — ADDERALL XR 30 MG PO CP24: 30.0000 mg | ORAL_CAPSULE | Freq: Every morning | ORAL | 0 refills | Status: DC

## 2022-07-18 ENCOUNTER — Encounter: Payer: 59 | Admitting: Obstetrics and Gynecology

## 2022-08-08 ENCOUNTER — Other Ambulatory Visit: Payer: Self-pay | Admitting: Family Medicine

## 2022-08-08 DIAGNOSIS — F902 Attention-deficit hyperactivity disorder, combined type: Secondary | ICD-10-CM

## 2022-08-08 DIAGNOSIS — Z30011 Encounter for initial prescription of contraceptive pills: Secondary | ICD-10-CM

## 2022-08-08 NOTE — Telephone Encounter (Signed)
[facility] the following medications:  norethindrone-ethinyl estradiol-FE (JUNEL FE 1/20) 1-20 MG-MCG tablet   Please advise.  

## 2022-08-11 ENCOUNTER — Telehealth: Payer: Self-pay | Admitting: Family Medicine

## 2022-08-11 ENCOUNTER — Other Ambulatory Visit: Payer: Self-pay | Admitting: Physician Assistant

## 2022-08-11 DIAGNOSIS — Z30011 Encounter for initial prescription of contraceptive pills: Secondary | ICD-10-CM

## 2022-08-11 DIAGNOSIS — F902 Attention-deficit hyperactivity disorder, combined type: Secondary | ICD-10-CM

## 2022-08-11 MED ORDER — NORETHIN ACE-ETH ESTRAD-FE 1-20 MG-MCG PO TABS: 1.0000 | ORAL_TABLET | Freq: Every day | ORAL | 0 refills | Status: DC

## 2022-08-11 MED ORDER — ADDERALL XR 30 MG PO CP24: 30.0000 mg | ORAL_CAPSULE | Freq: Every morning | ORAL | 0 refills | Status: DC

## 2022-08-11 NOTE — Telephone Encounter (Addendum)
Pt stated she needs [facility] [address] in [place].  Also requesting Adderall reorder. Last RF 07/11/22 #30. Meds not assigned to a protocol.  Requested Prescriptions  Pending Prescriptions Disp Refills   norethindrone-ethinyl estradiol-FE (JUNEL FE 1/20) 1-20 MG-MCG tablet 84 tablet 0    Sig: Take 1 tablet by mouth daily.     There is no refill protocol information for this order     ADDERALL XR 30 MG 24 hr capsule 30 capsule 0    Sig: Take 1 capsule (30 mg total) by mouth every morning.     There is no refill protocol information for this order

## 2022-08-11 NOTE — Addendum Note (Signed)
Addended by: Corky Sox E on: 08/11/2022 11:32 AM   Modules accepted: Orders

## 2022-08-11 NOTE — Telephone Encounter (Signed)
[facility] the following medications:  norethindrone-ethinyl estradiol-FE (JUNEL FE 1/20) 1-20 MG-MCG tablet   Please advise.

## 2022-08-11 NOTE — Telephone Encounter (Signed)
This encounter was created in error - please disregard.

## 2022-08-12 ENCOUNTER — Other Ambulatory Visit: Payer: Self-pay

## 2022-08-12 DIAGNOSIS — F902 Attention-deficit hyperactivity disorder, combined type: Secondary | ICD-10-CM

## 2022-08-12 DIAGNOSIS — Z30011 Encounter for initial prescription of contraceptive pills: Secondary | ICD-10-CM

## 2022-08-12 MED ORDER — ADDERALL XR 30 MG PO CP24: 30.0000 mg | ORAL_CAPSULE | Freq: Every morning | ORAL | 0 refills | Status: DC

## 2022-08-12 MED ORDER — NORETHIN ACE-ETH ESTRAD-FE 1-20 MG-MCG PO TABS: 1.0000 | ORAL_TABLET | Freq: Every day | ORAL | 0 refills | Status: DC

## 2022-08-12 NOTE — Telephone Encounter (Signed)
This was sent to an incorrect pharmacy.  I am unable to send.  Please send to [place] Thanks

## 2022-08-12 NOTE — Telephone Encounter (Addendum)
Caller name:  Litten,Erica Relation to pt: mother  Call back number: [phone]  [id number] [place], [place] [address] AT [place] Phone:  [phone]  Fax:  [phone]      Reason for call:  Caller is frustrated because patient called in and requested norethindrone-ethinyl estradiol-FE (JUNEL FE 1/20) 1-20 MG-MCG tablet and ADDERALL XR 30 MG 24 hr capsule to be sent into the [facility] in [place] and rx was sent into the wrong pharmacy. Caller would like this fixed today with a follow up call

## 2022-08-13 ENCOUNTER — Telehealth: Payer: Self-pay | Admitting: Family Medicine

## 2022-08-13 ENCOUNTER — Other Ambulatory Visit: Payer: Self-pay | Admitting: Physician Assistant

## 2022-08-13 DIAGNOSIS — F902 Attention-deficit hyperactivity disorder, combined type: Secondary | ICD-10-CM

## 2022-08-13 DIAGNOSIS — F909 Attention-deficit hyperactivity disorder, unspecified type: Secondary | ICD-10-CM

## 2022-08-13 MED ORDER — AMPHETAMINE-DEXTROAMPHET ER 30 MG PO CP24: 30.0000 mg | ORAL_CAPSULE | Freq: Every day | ORAL | 0 refills | Status: DC

## 2022-08-13 NOTE — Telephone Encounter (Signed)
I called and spoke with pharmacy. They will not fill prescription that was sent in on yesterday because they entered a fill date of 09/07/2022 when the electronic prescription came in. This fikk date was assigned because they saw where a [facility] was [facility] in  that had a fill date for 08/08/2022. Pharmacist is requesting that we send in a new prescription without the "comment of do not fill ". Please advise.

## 2022-08-13 NOTE — Telephone Encounter (Signed)
Pts mom calling again / [facility] [place] [facility] for Adderal and the [facility] in [place] has a note to not fill until 10.19.23/ pt is not able to get RX from [place] and they need a new RX sent or confirmation to remove October fill date / please advise asap / they have called several times and she ss going to cal back in an hr

## 2022-08-13 NOTE — Telephone Encounter (Signed)
The patient's mother is calling back requesting an update.Mom is upset that all of prescriptions take so long to be filled.   Please advise.

## 2022-08-13 NOTE — Telephone Encounter (Unsigned)
Copied from [place] [id number]. Topic: General - Other >> Aug 13, 2022 11:34 AM Everette C wrote: Reason for CRM: The patient's mother has called to request that the do-not-fill-until date be removed from the patient's ADDERALL XR 30 MG 24 hr capsule [[id number]]  [id number] [place], [address]  The patient's mother has been told that because their is a prescription ready in [place] the [facility] in [place] is unable to fill the prescription   The patient's mother will contact the [facility]S in [place] to cancel the refill at their location and would like to speak with a member of clinical staff further when possible   Please contact further

## 2022-08-23 ENCOUNTER — Other Ambulatory Visit: Payer: Self-pay | Admitting: Family Medicine

## 2022-08-25 ENCOUNTER — Other Ambulatory Visit: Payer: Self-pay | Admitting: Family Medicine

## 2022-08-25 DIAGNOSIS — Z30011 Encounter for initial prescription of contraceptive pills: Secondary | ICD-10-CM

## 2022-08-25 NOTE — Telephone Encounter (Signed)
Requested Prescriptions  Pending Prescriptions Disp Refills  . citalopram (CELEXA) 10 MG tablet [Pharmacy Med Name: CITALOPRAM HBR 10 MG TABLET] 90 tablet 0    Sig: TAKE 1 TABLET BY MOUTH EVERY DAY     Psychiatry:  Antidepressants - SSRI Passed - 08/23/2022  9:28 AM      Passed - Completed PHQ-2 or PHQ-9 in the last 360 days      Passed - Valid encounter within last 6 months    Recent Outpatient Visits          2 months ago Hair loss   [place], DO   6 months ago Current moderate episode of major depressive disorder without prior episode [facility])   [facility] [place], Dionne Bucy, MD   7 months ago Acute non-recurrent pansinusitis   [facility], Dionne Bucy, MD   10 months ago No-show for appointment   [facility], Dionne Bucy, MD   1 year ago Attention deficit hyperactivity disorder (ADHD), combined type   [facility], Dionne Bucy, MD

## 2022-11-24 HISTORY — PX: OTHER SURGICAL HISTORY: SHX169

## 2023-01-12 ENCOUNTER — Other Ambulatory Visit: Payer: Self-pay | Admitting: Physician Assistant

## 2023-01-12 DIAGNOSIS — Z30011 Encounter for initial prescription of contraceptive pills: Secondary | ICD-10-CM

## 2023-01-13 NOTE — Telephone Encounter (Signed)
Requested Prescriptions  Pending Prescriptions Disp Refills   JUNEL FE 1/20 1-20 MG-MCG tablet [Pharmacy Med Name: JUNEL FE 1 MG-20 MCG TABLET] 84 tablet 2    Sig: TAKE 1 TABLET BY MOUTH EVERY DAY     OB/GYN:  Contraceptives Passed - 01/12/2023  2:47 PM      Passed - Last BP in normal range    BP Readings from Last 1 Encounters:  05/28/22 113/80         Passed - Valid encounter within last 12 months    Recent Outpatient Visits           7 months ago Hair loss   [facility] Rory Percy M, DO   10 months ago Current moderate episode of major depressive disorder without prior episode [facility])   [facility], Dionne Bucy, MD   1 year ago Acute non-recurrent pansinusitis   [place], Dionne Bucy, MD   1 year ago No-show for appointment   [facility] [place], Dionne Bucy, MD   1 year ago Attention deficit hyperactivity disorder (ADHD), combined type   [facility] [place], Dionne Bucy, MD              Passed - Patient is not a smoker

## 2023-06-07 ENCOUNTER — Other Ambulatory Visit: Payer: Self-pay | Admitting: Family Medicine

## 2023-06-07 DIAGNOSIS — Z30011 Encounter for initial prescription of contraceptive pills: Secondary | ICD-10-CM

## 2023-06-09 NOTE — Telephone Encounter (Signed)
 Called patient to schedule appt for medication refills. No answer, LVMTCB [phone].

## 2023-06-09 NOTE — Telephone Encounter (Signed)
Requested medication (s) are due for refill today: na   Requested medication (s) are on the active medication list: yes   Last refill:  01/13/23 #84 2 refills   Future visit scheduled: no   Notes to clinic:   last ordered by A. Rumball, DO 01/13/23. Last OV 05/28/22. Called patient to schedule appt for med refills. No answer LVMTCB. Do you want to refill Rx?     Requested Prescriptions  Pending Prescriptions Disp Refills   JUNEL FE 1/20 1-20 MG-MCG tablet [Pharmacy Med Name: JUNEL FE 1 MG-20 MCG TABLET] 84 tablet 2    Sig: TAKE 1 TABLET BY MOUTH EVERY DAY     OB/GYN:  Contraceptives Failed - 06/07/2023 10:42 PM      Failed - Valid encounter within last 12 months    Recent Outpatient Visits           1 year ago Hair loss   [facility] [place], Darl Householder, DO   1 year ago Current moderate episode of major depressive disorder without prior episode [facility])   [facility] [place], Marzella Schlein, MD   1 year ago Acute non-recurrent pansinusitis   [facility] [place], Marzella Schlein, MD   1 year ago No-show for appointment   [facility] [place], Marzella Schlein, MD   1 year ago Attention deficit hyperactivity disorder (ADHD), combined type   [facility] [place], Marzella Schlein, MD              Passed - Last BP in normal range    BP Readings from Last 1 Encounters:  05/28/22 113/80         Passed - Patient is not a smoker

## 2023-11-20 ENCOUNTER — Other Ambulatory Visit: Payer: Self-pay | Admitting: Family Medicine

## 2023-11-20 DIAGNOSIS — Z30011 Encounter for initial prescription of contraceptive pills: Secondary | ICD-10-CM

## 2023-12-16 ENCOUNTER — Encounter (HOSPITAL_COMMUNITY): Payer: 59 | Admitting: Occupational Therapy

## 2023-12-21 ENCOUNTER — Ambulatory Visit: Payer: 59 | Admitting: Physician Assistant

## 2024-04-26 ENCOUNTER — Ambulatory Visit
Admission: EM | Admit: 2024-04-26 | Discharge: 2024-04-26 | Disposition: A | Discharge status: Home / Self Care | Attending: Nurse Practitioner | Admitting: Nurse Practitioner

## 2024-04-26 ENCOUNTER — Encounter: Payer: Self-pay | Admitting: Emergency Medicine

## 2024-04-26 DIAGNOSIS — J020 Streptococcal pharyngitis: Secondary | ICD-10-CM | POA: Diagnosis not present

## 2024-04-26 LAB — POCT RAPID STREP A (OFFICE): Rapid Strep A Screen: POSITIVE — AB

## 2024-04-26 MED ORDER — AMOXICILLIN 500 MG PO CAPS: 500.0000 mg | ORAL_CAPSULE | Freq: Two times a day (BID) | ORAL | 0 refills | Status: AC

## 2024-04-26 NOTE — Discharge Instructions (Signed)
 The rapid strep test was positive. Take medications as prescribed. Increase fluids and allow for plenty of rest. Recommend over-the-counter Tylenol or Ibuprofen as needed for pain, fever, or general discomfort. Warm salt water gargles 3-4 times daily to help with throat pain or discomfort.  Also recommend over-the-counter Chloraseptic throat spray or throat lozenges while symptoms persist. Recommend a diet with soft foods to include soups, broths, puddings, yogurt, Jell-O's, or popsicles until symptoms improve.  Also recommend foods that add bulk to your stool to include high-fiber foods. Change toothbrush after 3 days. Follow-up if symptoms do not improve.

## 2024-04-26 NOTE — ED Triage Notes (Signed)
 Sore throat, stomach pain, and diarrhea x 2 days.

## 2024-04-26 NOTE — ED Provider Notes (Signed)
 [facility]    CSN: [id number] Arrival date & time: 04/26/24  1128      History   Chief Complaint No chief complaint on file.   HPI Krista Holmes is a 23 y.o. female.   The history is provided by the patient.   Patient presents with a 2-day history of sore throat, abdominal pain, and diarrhea.  Patient suspects she may have also experienced fever.  Denies headache, ear pain, nasal congestion, runny nose, cough, nausea, or vomiting.  Patient states diarrhea has been the same.  She denies exposure to any close sick contacts.  States she has been taking over-the-counter analgesics for her symptoms. Past Medical History:  Diagnosis Date   Allergy    Seasonal allergies/ sneezing, coughing etc   Dysmenorrhea    GERD (gastroesophageal reflux disease)     Patient Active Problem List   Diagnosis Date Noted   Current moderate episode of major depressive disorder without prior episode (HCC) 02/25/2022   ADHD 08/05/2019   Anxiety 08/05/2019    Past Surgical History:  Procedure Laterality Date   LEG SURGERY Bilateral    NO PAST SURGERIES      OB History     Gravida  0   Para  0   Term  0   Preterm  0   AB  0   Living  0      SAB  0   IAB  0   Ectopic  0   Multiple  0   Live Births  0            Home Medications    Prior to Admission medications   Medication Sig Start Date End Date Taking? Authorizing Provider  amphetamine -dextroamphetamine (ADDERALL  XR) 30 MG 24 hr capsule Take 1 capsule (30 mg total) by mouth daily. 08/13/22   Ostwalt, Janna, PA-C  JUNEL FE 1/20 1-20 MG-MCG tablet TAKE 1 TABLET BY MOUTH EVERY DAY 11/24/23   [place]ac[place], Stan Eans, MD    Family History Family History  Problem Relation Age of Onset   Heart attack Maternal Grandmother     Social History Social History   Tobacco Use   Smoking status: Never   Smokeless tobacco: Never  Vaping Use   Vaping status: Never Used  Substance Use Topics   Alcohol use:  Yes    Comment: occasional   Drug use: No     Allergies   Other, Omnicef [cefdinir], and Zithromax [azithromycin]   Review of Systems Review of Systems Per HPI  Physical Exam Triage Vital Signs ED Triage Vitals  Encounter Vitals Group     BP 04/26/24 1141 129/87     Systolic BP Percentile --      Diastolic BP Percentile --      Pulse Rate 04/26/24 1141 (!) 109     Resp 04/26/24 1141 18     Temp 04/26/24 1141 98.3 F (36.8 C)     Temp Source 04/26/24 1141 Oral     SpO2 04/26/24 1141 98 %     Weight --      Height --      Head Circumference --      Peak Flow --      Pain Score 04/26/24 1142 6     Pain Loc --      Pain Education --      Exclude from Growth Chart --    No data found.  Updated Vital Signs BP 129/87 (BP Location: Right Arm)  Pulse (!) 109   Temp 98.3 F (36.8 C) (Oral)   Resp 18   LMP 04/06/2024 (Approximate)   SpO2 98%   Visual Acuity Right Eye Distance:   Left Eye Distance:   Bilateral Distance:    Right Eye Near:   Left Eye Near:    Bilateral Near:     Physical Exam Vitals and nursing note reviewed.  Constitutional:      General: She is not in acute distress.    Appearance: Normal appearance.  HENT:     Head: Normocephalic.     Right Ear: Tympanic membrane, ear canal and external ear normal.     Left Ear: Tympanic membrane, ear canal and external ear normal.     Nose: Nose normal.     Mouth/Throat:     Lips: Pink.     Mouth: Mucous membranes are moist.     Pharynx: Uvula midline. Pharyngeal swelling, oropharyngeal exudate, posterior oropharyngeal erythema, uvula swelling and postnasal drip present.     Tonsils: Tonsillar exudate present. 1+ on the right. 1+ on the left.  Eyes:     Extraocular Movements: Extraocular movements intact.     Conjunctiva/sclera: Conjunctivae normal.     Pupils: Pupils are equal, round, and reactive to light.  Cardiovascular:     Rate and Rhythm: Regular rhythm. Tachycardia present.     Pulses:  Normal pulses.     Heart sounds: Normal heart sounds.  Pulmonary:     Effort: Pulmonary effort is normal. No respiratory distress.     Breath sounds: Normal breath sounds. No stridor. No wheezing, rhonchi or rales.  Abdominal:     General: Bowel sounds are normal.     Palpations: Abdomen is soft.     Tenderness: There is no abdominal tenderness.  Musculoskeletal:     Cervical back: Normal range of motion.  Lymphadenopathy:     Cervical: No cervical adenopathy.  Skin:    General: Skin is warm and dry.  Neurological:     General: No focal deficit present.     Mental Status: She is alert and oriented to person, place, and time.  Psychiatric:        Mood and Affect: Mood normal.        Behavior: Behavior normal.      UC Treatments / Results  Labs (all labs ordered are listed, but only abnormal results are displayed) Labs Reviewed  POCT RAPID STREP A (OFFICE) - Abnormal; Notable for the following components:      Result Value   Rapid Strep A Screen Positive (*)    All other components within normal limits    EKG   Radiology No results found.  Procedures Procedures (including critical care time)  Medications Ordered in UC Medications - No data to display  Initial Impression / Assessment and Plan / UC Course  I have reviewed the triage vital signs and the nursing notes.  Pertinent labs & imaging results that were available during my care of the patient were reviewed by me and considered in my medical decision making (see chart for details).  The rapid strep test was positive.  Will treat with amoxicillin  500 mg twice daily for the next 10 days.  Viscous lidocaine 2% also prescribed for throat pain or discomfort.  Supportive care recommendations were provided and discussed with the patient to include fluids, rest, warm salt water gargles, over-the-counter analgesics, and increasing the bulk in her stool.  Patient was given indications regarding follow-up.  Patient  was in  agreement with this plan of care and verbalizes understanding.  All questions were answered.  Patient stable for discharge.   Final Clinical Impressions(s) / UC Diagnoses   Final diagnoses:  None   Discharge Instructions   None    ED Prescriptions   None    PDMP not reviewed this encounter.   Hardy Lia, NP 04/26/24 1159

## 2024-07-05 ENCOUNTER — Ambulatory Visit: Admitting: Family Medicine

## 2024-08-26 ENCOUNTER — Ambulatory Visit: Admitting: Pediatrics

## 2024-09-30 ENCOUNTER — Other Ambulatory Visit: Payer: Self-pay | Admitting: Neurology

## 2024-09-30 DIAGNOSIS — R2990 Unspecified symptoms and signs involving the nervous system: Secondary | ICD-10-CM

## 2024-10-08 ENCOUNTER — Inpatient Hospital Stay: Admission: RE | Admit: 2024-10-08

## 2024-10-12 ENCOUNTER — Ambulatory Visit: Admitting: Family Medicine

## 2024-10-12 ENCOUNTER — Encounter: Payer: Self-pay | Admitting: Family Medicine

## 2024-10-12 VITALS — BP 121/83 | HR 123 | Temp 98.4°F | Resp 16 | Ht 64.02 in | Wt 138.8 lb

## 2024-10-12 DIAGNOSIS — F909 Attention-deficit hyperactivity disorder, unspecified type: Secondary | ICD-10-CM

## 2024-10-12 DIAGNOSIS — Z30011 Encounter for initial prescription of contraceptive pills: Secondary | ICD-10-CM | POA: Diagnosis not present

## 2024-10-12 MED ORDER — JUNEL FE 1/20 1-20 MG-MCG PO TABS: 1.0000 | ORAL_TABLET | Freq: Every day | ORAL | 3 refills | Status: AC
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
## 2024-10-12 NOTE — Assessment & Plan Note (Signed)
 Not doing well with her 20mg . Will discuss further at her physical next week. Call with any concerns.

## 2024-10-18 ENCOUNTER — Other Ambulatory Visit: Payer: Self-pay | Admitting: Family Medicine

## 2024-10-18 ENCOUNTER — Encounter: Admitting: Family Medicine

## 2024-10-18 DIAGNOSIS — F909 Attention-deficit hyperactivity disorder, unspecified type: Secondary | ICD-10-CM

## 2024-10-18 NOTE — Telephone Encounter (Unsigned)
 Copied from CRM [id number]. Topic: Clinical - Medication Refill >> Oct 18, 2024 12:16 PM Joesph B wrote: Medication: amphetamine -dextroamphetamine (ADDERALL  XR) 20 MG 24 hr capsule  Patient unable to make her physical appt today due to her father being admitted into the hospital, she needs her medication refilled.   Has the patient contacted their pharmacy? Yes (Agent: If no, request that the patient contact the pharmacy for the refill. If patient does not wish to contact the pharmacy document the reason why and proceed with request.) (Agent: If yes, when and what did the pharmacy advise?)  This is the patient's preferred pharmacy:    [id number] [place], [place] - [address]  Phone: [phone] Fax: [phone]    Is this the correct pharmacy for this prescription? Yes If no, delete pharmacy and type the correct one.   Has the prescription been filled recently? Yes  Is the patient out of the medication? No  Has the patient been seen for an appointment in the last year OR does the patient have an upcoming appointment? Yes  Can we respond through MyChart? Yes  Agent: Please be advised that Rx refills may take up to 3 business days. We ask that you follow-up with your pharmacy.

## 2024-10-19 ENCOUNTER — Other Ambulatory Visit

## 2024-10-19 NOTE — Telephone Encounter (Signed)
 Requested medication (s) are due for refill today: yes  Requested medication (s) are on the active medication list: yes  Last refill:  10/12/24  Future visit scheduled: yes  Notes to clinic:  Unable to refill per protocol, cannot delegate.      Requested Prescriptions  Pending Prescriptions Disp Refills   amphetamine -dextroamphetamine (ADDERALL  XR) 20 MG 24 hr capsule  0    Sig: Take 1 capsule (20 mg total) by mouth daily.     Not Delegated - Psychiatry:  Stimulants/ADHD Failed - 10/19/2024  4:12 PM      Failed - This refill cannot be delegated      Failed - Urine Drug Screen completed in last 360 days      Failed - Last Heart Rate in normal range    Pulse Readings from Last 1 Encounters:  10/12/24 (!) 123         Passed - Last BP in normal range    BP Readings from Last 1 Encounters:  10/12/24 121/83         Passed - Valid encounter within last 6 months    Recent Outpatient Visits           1 week ago Attention deficit hyperactivity disorder (ADHD), unspecified ADHD type   [facility] [place], [place], DO

## 2024-10-25 ENCOUNTER — Other Ambulatory Visit (HOSPITAL_COMMUNITY)
Admission: RE | Admit: 2024-10-25 | Discharge: 2024-10-25 | Disposition: A | Discharge status: Home / Self Care | Source: Ambulatory Visit | Attending: Family Medicine | Admitting: Family Medicine

## 2024-10-25 ENCOUNTER — Ambulatory Visit
Admission: RE | Admit: 2024-10-25 | Discharge: 2024-10-25 | Disposition: A | Source: Ambulatory Visit | Attending: Neurology | Admitting: Neurology

## 2024-10-25 ENCOUNTER — Ambulatory Visit: Admitting: Family Medicine

## 2024-10-25 VITALS — BP 123/84 | HR 120 | Temp 98.1°F | Ht 64.0 in | Wt 139.0 lb

## 2024-10-25 DIAGNOSIS — F909 Attention-deficit hyperactivity disorder, unspecified type: Secondary | ICD-10-CM

## 2024-10-25 DIAGNOSIS — H6691 Otitis media, unspecified, right ear: Secondary | ICD-10-CM

## 2024-10-25 DIAGNOSIS — R2990 Unspecified symptoms and signs involving the nervous system: Secondary | ICD-10-CM

## 2024-10-25 DIAGNOSIS — Z0000 Encounter for general adult medical examination without abnormal findings: Secondary | ICD-10-CM

## 2024-10-25 DIAGNOSIS — Z79899 Other long term (current) drug therapy: Secondary | ICD-10-CM | POA: Insufficient documentation

## 2024-10-25 MED ORDER — AMPHETAMINE-DEXTROAMPHET ER 30 MG PO CP24: 30.0000 mg | ORAL_CAPSULE | ORAL | 0 refills | Status: AC

## 2024-10-25 MED ORDER — AMPHETAMINE-DEXTROAMPHET ER 30 MG PO CP24: 30.0000 mg | ORAL_CAPSULE | Freq: Every day | ORAL | 0 refills | Status: AC

## 2024-10-25 MED ORDER — AMOXICILLIN-POT CLAVULANATE 875-125 MG PO TABS: 1.0000 | ORAL_TABLET | Freq: Two times a day (BID) | ORAL | 0 refills | Status: DC

## 2024-10-25 NOTE — Assessment & Plan Note (Signed)
 Under good control on current regimen. Continue current regimen. Continue to monitor. Call with any concerns. Refills given for 3 months. Follow up 3 months.
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
## 2024-10-25 NOTE — Assessment & Plan Note (Signed)
 Controlled substance agreement signed today- see scanned document.

## 2024-10-26 ENCOUNTER — Ambulatory Visit: Payer: Self-pay | Admitting: Family Medicine

## 2024-10-26 LAB — URINALYSIS, ROUTINE W REFLEX MICROSCOPIC
Bilirubin, UA: NEGATIVE
Glucose, UA: NEGATIVE
Ketones, UA: NEGATIVE
Leukocytes,UA: NEGATIVE
Nitrite, UA: NEGATIVE
Specific Gravity, UA: 1.02 (ref 1.005–1.030)
Urobilinogen, Ur: 1 mg/dL (ref 0.2–1.0)
pH, UA: 7.5 (ref 5.0–7.5)

## 2024-10-26 LAB — MICROSCOPIC EXAMINATION
Bacteria, UA: NONE SEEN
WBC, UA: NONE SEEN /HPF (ref 0–5)

## 2024-10-27 LAB — COMPREHENSIVE METABOLIC PANEL WITH GFR
ALT: 12 IU/L (ref 0–32)
AST: 11 IU/L (ref 0–40)
Albumin: 4.3 g/dL (ref 4.0–5.0)
Alkaline Phosphatase: 72 IU/L (ref 41–116)
BUN/Creatinine Ratio: 18 (ref 9–23)
BUN: 13 mg/dL (ref 6–20)
Bilirubin Total: <0.2 mg/dL (ref 0.0–1.2)
CO2: 22 mmol/L (ref 20–29)
Calcium: 9.2 mg/dL (ref 8.7–10.2)
Chloride: 104 mmol/L (ref 96–106)
Creatinine, Ser: 0.72 mg/dL (ref 0.57–1.00)
Globulin, Total: 2.1 g/dL (ref 1.5–4.5)
Glucose: 97 mg/dL (ref 70–99)
Potassium: 4.7 mmol/L (ref 3.5–5.2)
Sodium: 142 mmol/L (ref 134–144)
Total Protein: 6.4 g/dL (ref 6.0–8.5)
eGFR: 120 mL/min/1.73 (ref >59)

## 2024-10-27 LAB — CBC WITH DIFFERENTIAL/PLATELET
Basophils Absolute: 0 x10E3/uL (ref 0.0–0.2)
Basos: 0 %
EOS (ABSOLUTE): 0.1 x10E3/uL (ref 0.0–0.4)
Eos: 1 %
Hematocrit: 40.8 % (ref 34.0–46.6)
Hemoglobin: 13.2 g/dL (ref 11.1–15.9)
Immature Grans (Abs): 0 x10E3/uL (ref 0.0–0.1)
Immature Granulocytes: 0 %
Lymphocytes Absolute: 1.9 x10E3/uL (ref 0.7–3.1)
Lymphs: 21 %
MCH: 29.2 pg (ref 26.6–33.0)
MCHC: 32.4 g/dL (ref 31.5–35.7)
MCV: 90 fL (ref 79–97)
Monocytes Absolute: 0.8 x10E3/uL (ref 0.1–0.9)
Monocytes: 8 %
Neutrophils Absolute: 6.4 x10E3/uL (ref 1.4–7.0)
Neutrophils: 70 %
Platelets: 304 x10E3/uL (ref 150–450)
RBC: 4.52 x10E6/uL (ref 3.77–5.28)
RDW: 11.3 % — ABNORMAL LOW (ref 11.7–15.4)
WBC: 9.2 x10E3/uL (ref 3.4–10.8)

## 2024-10-27 LAB — GC/CHLAMYDIA PROBE AMP
Chlamydia trachomatis, NAA: NEGATIVE
Neisseria Gonorrhoeae by PCR: NEGATIVE

## 2024-10-27 LAB — LIPID PANEL W/O CHOL/HDL RATIO
Cholesterol, Total: 141 mg/dL (ref 100–199)
HDL: 80 mg/dL (ref >39)
LDL Chol Calc (NIH): 50 mg/dL (ref 0–99)
Triglycerides: 52 mg/dL (ref 0–149)
VLDL Cholesterol Cal: 11 mg/dL (ref 5–40)

## 2024-10-27 LAB — HIV ANTIBODY (ROUTINE TESTING W REFLEX): HIV Screen 4th Generation wRfx: NONREACTIVE

## 2024-10-27 LAB — TSH: TSH: 1.04 u[IU]/mL (ref 0.450–4.500)

## 2024-10-27 LAB — HEPATITIS C ANTIBODY: Hep C Virus Ab: NONREACTIVE

## 2024-10-28 LAB — CYTOLOGY - PAP
Adequacy: ABSENT
Diagnosis: NEGATIVE

## 2024-11-09 ENCOUNTER — Encounter: Payer: Self-pay | Admitting: Family Medicine

## 2024-11-09 NOTE — Telephone Encounter (Signed)
 Called patient and left a message to call back to get scheduled.

## 2024-11-09 NOTE — Telephone Encounter (Signed)
 Appt Ok to double book or put on Christmas Eve

## 2024-11-29 ENCOUNTER — Ambulatory Visit: Payer: Self-pay

## 2024-11-29 ENCOUNTER — Ambulatory Visit: Admitting: Internal Medicine

## 2024-11-29 VITALS — BP 120/82 | Ht 64.0 in | Wt 135.0 lb

## 2024-11-29 DIAGNOSIS — H9201 Otalgia, right ear: Secondary | ICD-10-CM

## 2024-11-29 DIAGNOSIS — Z9109 Other allergy status, other than to drugs and biological substances: Secondary | ICD-10-CM | POA: Diagnosis not present

## 2024-11-29 NOTE — Patient Instructions (Signed)
 Eustachian Tube Dysfunction  Eustachian tube dysfunction refers to a condition in which a blockage develops in the narrow passage that connects the middle ear to the back of the nose (eustachian tube). The eustachian tube regulates air pressure in the middle ear by letting air move between the ear and nose. It also helps to drain fluid from the middle ear space. Eustachian tube dysfunction can affect one or both ears. When the eustachian tube does not function properly, air pressure, fluid, or both can build up in the middle ear. What are the causes? This condition occurs when the eustachian tube becomes blocked or cannot open normally. Common causes of this condition include: Ear infections. Colds and other infections that affect the nose, mouth, and throat (upper respiratory tract). Allergies. Irritation from cigarette smoke. Irritation from stomach acid coming up into the esophagus (gastroesophageal reflux). The esophagus is the part of the body that moves food from the mouth to the stomach. Sudden changes in air pressure, such as from descending in an airplane or scuba diving. Abnormal growths in the nose or throat, such as: Growths that line the nose (nasal polyps). Abnormal growth of cells (tumors). Enlarged tissue at the back of the throat (adenoids). What increases the risk? You are more likely to develop this condition if: You smoke. You are overweight. You are a child who has: Certain birth defects of the mouth, such as cleft palate. Large tonsils or adenoids. What are the signs or symptoms? Common symptoms of this condition include: A feeling of fullness in the ear. Ear pain. Clicking or popping noises in the ear. Ringing in the ear (tinnitus). Hearing loss. Loss of balance. Dizziness. Symptoms may get worse when the air pressure around you changes, such as when you travel to an area of high elevation, fly on an airplane, or go scuba diving. How is this diagnosed? This  condition may be diagnosed based on: Your symptoms. A physical exam of your ears, nose, and throat. Tests, such as those that measure: The movement of your eardrum. Your hearing (audiometry). How is this treated? Treatment depends on the cause and severity of your condition. In mild cases, you may relieve your symptoms by moving air into your ears. This is called "popping the ears." In more severe cases, or if you have symptoms of fluid in your ears, treatment may include: Medicines to relieve congestion (decongestants). Medicines that treat allergies (antihistamines). Nasal sprays or ear drops that contain medicines that reduce swelling (steroids). A procedure to drain the fluid in your eardrum. In this procedure, a small tube may be placed in the eardrum to: Drain the fluid. Restore the air in the middle ear space. A procedure to insert a balloon device through the nose to inflate the opening of the eustachian tube (balloon dilation). Follow these instructions at home: Lifestyle Do not do any of the following until your health care provider approves: Travel to high altitudes. Fly in airplanes. Work in a Estate agent or room. Scuba dive. Do not use any products that contain nicotine or tobacco. These products include cigarettes, chewing tobacco, and vaping devices, such as e-cigarettes. If you need help quitting, ask your health care provider. Keep your ears dry. Wear fitted earplugs during showering and bathing. Dry your ears completely after. General instructions Take over-the-counter and prescription medicines only as told by your health care provider. Use techniques to help pop your ears as recommended by your health care provider. These may include: Chewing gum. Yawning. Frequent, forceful swallowing.  Closing your mouth, holding your nose closed, and gently blowing as if you are trying to blow air out of your nose. Keep all follow-up visits. This is important. Contact a  health care provider if: Your symptoms do not go away after treatment. Your symptoms come back after treatment. You are unable to pop your ears. You have: A fever. Pain in your ear. Pain in your head or neck. Fluid draining from your ear. Your hearing suddenly changes. You become very dizzy. You lose your balance. Get help right away if: You have a sudden, severe increase in any of your symptoms. Summary Eustachian tube dysfunction refers to a condition in which a blockage develops in the eustachian tube. It can be caused by ear infections, allergies, inhaled irritants, or abnormal growths in the nose or throat. Symptoms may include ear pain or fullness, hearing loss, or ringing in the ears. Mild cases are treated with techniques to unblock the ears, such as yawning or chewing gum. More severe cases are treated with medicines or procedures. This information is not intended to replace advice given to you by your health care provider. Make sure you discuss any questions you have with your health care provider. Document Revised: 01/21/2021 Document Reviewed: 01/21/2021 Elsevier Patient Education  2024 [employer].

## 2024-11-29 NOTE — Telephone Encounter (Signed)
 FYI Only or Action Required?: Action required by provider: request for appointment.  Patient was last seen in primary care on 10/25/2024 by Krista Duwaine SQUIBB, DO.  Called Nurse Triage reporting Otalgia.  Symptoms began several weeks ago.  Interventions attempted: Prescription medications: antibiotic.  Symptoms are: gradually worsening. Ledft ear pain x 2 weeks. Has been on antibiotics, no better.  Triage Disposition: See Physician Within 24 Hours  Patient/caregiver understands and will follow disposition?: Yes       Copied from CRM #[id number]. Topic: Clinical - Red Word Triage >> Nov 29, 2024 10:01 AM Tiffini S wrote: [facility] that prompted transfer to Nurse Triage: Patient mother Krista Holmes had ear infection in December/ was given antibiotics- symptoms are worsening with ear pain and headaches Reason for Disposition  Earache  (Exceptions: Brief ear pain of lasting less than 60 minutes, or earache occurring during air travel.)  Answer Assessment - Initial Assessment Questions 1. LOCATION: Which ear is involved?     left 2. ONSET: When did the ear pain start?      2 weeks 3. SEVERITY: How bad is the pain?  (Scale 1-10; mild, moderate or severe)     severe 4. URI SYMPTOMS: Do you have a runny nose or cough?     no 5. FEVER: Do you have a fever? If Yes, ask: What is your temperature, how was it measured, and when did it start?     no 6. CAUSE: Have you been swimming recently?, How often do you use Q-TIPS?, Have you had any recent air travel or scuba diving?     no 7. OTHER SYMPTOMS: Do you have any other symptoms? (e.g., decreased hearing, dizziness, headache, stiff neck, vomiting)     Decreased hearing, headache 8. PREGNANCY: Is there any chance you are pregnant? When was your last menstrual period?     no  Protocols used: [place]illa
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
## 2025-01-24 ENCOUNTER — Ambulatory Visit: Admitting: Family Medicine
